# Patient Record
Sex: Male | Born: 1940 | Race: White | Hispanic: No | Marital: Married
Health system: Midwestern US, Academic
[De-identification: ages and names within clinical notes are randomized; demographics above are authoritative.]

---

## 2016-10-19 ENCOUNTER — Encounter: Admit: 2016-10-19 | Discharge: 2016-10-19 | Payer: MEDICARE

## 2016-10-19 DIAGNOSIS — I4891 Unspecified atrial fibrillation: Principal | ICD-10-CM

## 2016-10-22 ENCOUNTER — Encounter: Admit: 2016-10-22 | Discharge: 2016-10-22 | Payer: MEDICARE

## 2016-10-22 ENCOUNTER — Ambulatory Visit: Admit: 2016-10-22 | Discharge: 2016-10-23 | Payer: MEDICARE

## 2016-10-22 DIAGNOSIS — F17200 Nicotine dependence, unspecified, uncomplicated: ICD-10-CM

## 2016-10-22 DIAGNOSIS — I4891 Unspecified atrial fibrillation: ICD-10-CM

## 2016-10-22 DIAGNOSIS — R9439 Abnormal result of other cardiovascular function study: Principal | ICD-10-CM

## 2016-10-22 DIAGNOSIS — I10 Essential (primary) hypertension: ICD-10-CM

## 2016-10-22 DIAGNOSIS — I2510 Atherosclerotic heart disease of native coronary artery without angina pectoris: ICD-10-CM

## 2016-10-22 DIAGNOSIS — G4730 Sleep apnea, unspecified: ICD-10-CM

## 2016-10-22 DIAGNOSIS — I480 Paroxysmal atrial fibrillation: ICD-10-CM

## 2016-10-22 DIAGNOSIS — E785 Hyperlipidemia, unspecified: ICD-10-CM

## 2016-10-22 DIAGNOSIS — E669 Obesity, unspecified: ICD-10-CM

## 2016-10-22 DIAGNOSIS — E119 Type 2 diabetes mellitus without complications: ICD-10-CM

## 2016-10-22 DIAGNOSIS — E782 Mixed hyperlipidemia: ICD-10-CM

## 2016-10-22 MED ORDER — SIMVASTATIN 80 MG PO TAB
80 mg | ORAL_TABLET | Freq: Every evening | ORAL | 3 refills | Status: AC
Start: 2016-10-22 — End: 2018-03-25

## 2016-10-22 NOTE — Progress Notes
Date of Service: 10/22/2016    Douglas Fernandez is a 76 y.o. male.       HPI     I had the pleasure of seeing Douglas Fernandez today for follow-up of his coronary artery disease post history of PCI, paroxysmal atrial fibrillation currently controlled on dofetilide 250 mg twice daily and Coumadin anticoagulation, hypertension, hyperlipidemia, diabetes mellitus type 2, chronic lower leg edema with stasis changes, obstructive sleep apnea using CPAP, and morbid obesity.  He was seen in our office on August 20, 2016 by Dr. Avie Arenas.  At that time he was having no cardiovascular complaints and his EKG revealed a normal sinus rhythm.  His dofetilide dose had been decreased previously due to renal failure.    Douglas Fernandez presents to the office today telling me that very occasionally with change of position he will briefly feel dizzy.  He does have a cough in the morning that he blames on his CPAP causing his throat to be dry.  He does note chronic mild lower leg edema.  He has stasis changes on his lower legs and healing  water blisters .  He tells me he works very hard at a low salt diet.  He notes his blood sugars have been running very well controlled recently.  His main complaint today is he does have burning in his feet.    Preliminary EKG reveals a sinus rhythm at 65 bpm.  Multiple atrial premature complexes.  Borderline T abnormalities, anterior???lateral leads.  PR 184, QRS 90, QTc 485.    Assessment and plan    1.  Coronary artery disease post PCI of the RCA and LAD.  He denies any chest pain.  He continues on aspirin, metoprolol, and valsartan.    2.  Paroxysmal atrial fibrillation???stable.  His EKG reveals he is in sinus rhythm.  He is not aware of any palpitations although he tells me he has never been aware of palpitations.  He continues on dofetilide 250 mg daily and Coumadin anticoagulation.  His INRs are followed through our office and his last INR was at goal at 2.3 on 09/07/2016.    3.  Hypertension at goal. 4.  Hyperlipidemia.  His most recent cholesterol labs from August 20, 2016 revealed total cholesterol 156, triglycerides 196, HDL 41, LDL 92.  He tells me he is tolerating his simvastatin 20 mg daily without any problems.  He tells me he cannot remember ever being on a higher dose of simvastatin.  With his history of coronary artery disease his LDL goal is less than 70.  He has been asked to increase his simvastatin to 80 mg daily.  He has been given a lab order to check a fasting lipid profile AST and ALT in 3 months.    5.  Obstructive sleep apnea???wearing CPAP consistently at night.    6. Morbid obesity.  We discussed working harder at portion control to help with weight loss.    7.  Feet burning.  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  He has been asked to follow-up with his PCP to address this.    He will plan to follow-up in our office with Dr. Doretha Imus in 3 months.  We can readdress his lipid labs at that time.    Thank you for the opportunity to participate in the care of this patient.  Please feel free to call us if you have any questions or concerns.    Loraine Leriche,  APRN-C  DRB         Vitals:    10/22/16 1417 10/22/16 1426   BP: 112/68 118/68   Pulse: 65    Weight: 120.5 kg (265 lb 9.6 oz)    Height: 1.753 m (5' 9)      Body mass index is 39.22 kg/m???.     Past Medical History  Patient Active Problem List    Diagnosis Date Noted   ??? On dofetilide therapy 08/20/2016   ??? Warfarin anticoagulation 08/20/2016   ??? Bilateral edema of lower extremity 05/08/2014   ??? Sleep apnea 12/06/2012   ??? Renal failure 08/25/2012   ??? Atrial fibrillation (HCC) 06/30/2012     06/24/2012 - Symptoms of DOE, AF on EKG.  07/28/2012 - TEE and DCCV:  Successful DC cardioversion of Atrial fibrillation to sinus rhythm.  08/25/2012 - Pt in AF, Multaq stopped.  11/21/2012 - Hospital admission for Tikosyn initiation, DCCV to restore NSR.     ??? History of tobacco abuse 06/30/2012 ??? Dyspnea 12/19/2010   ??? Chest pain 10/15/2009   ??? Type II diabetes mellitus (HCC) 10/15/2009   ??? Angina pectoris (HCC) 11/25/2006   ??? CAD (coronary artery disease) 11/25/2006     11/2006:  DES to mid and distal LAD after abnormal stress testing, done for chest discomfort  10/09 :   DES to mid-LAD after presentation with ACS related to restenosis.  Noted to have 60% distal RCA stenosis at that time.  01/11/2011 - Stress Test:  This is a normal study.  There are no fixed or reversible perfusion abnormalities identified.  The LV function is preserved with an EF calculated at 67 percent.  The ECG portion of this study is unremarkable and not suggestive of ischemia.  No high risk prognostic indicators are associated with this study.       ??? Abnormal cardiovascular stress test    ??? Obesity    ??? Hypertension    ??? Hyperlipidemia          Review of Systems   Constitution: Positive for weight gain.   HENT: Positive for hearing loss.    Eyes: Negative.    Cardiovascular: Positive for claudication, dyspnea on exertion and leg swelling.   Respiratory: Positive for sleep disturbances due to breathing.    Endocrine: Negative.    Hematologic/Lymphatic: Negative.    Skin: Negative.    Musculoskeletal: Positive for arthritis, back pain, joint pain, joint swelling and muscle weakness.   Gastrointestinal: Positive for heartburn.   Genitourinary: Negative.    Neurological: Negative.    Psychiatric/Behavioral: Negative.    Allergic/Immunologic: Negative.    He denies having chest pain, palpitations, PND, orthopnea,  pre-syncope, syncope,  wheezing, shortness of air, dyspnea, sputum production, or hemoptysis. He uses no tobacco products.      Physical Exam   General Appearance: resting comfortably, no acute distress, obese  Skin: warm, moist, healing water blisters on lower legs bilaterally  Eyes: conjunctivae and lids normal, pupils are equal and round  Lips & Oral Mucosa: no pallor or cyanosis  Ear, Nose, Throat: No deformities Neck Veins: neck veins are flat, neck veins are not distended  Thyroid: no nodules, masses, tenderness or enlargement  Chest Inspection: chest is normal in appearance  Respiratory Effort: breathing is unlabored, no respiratory distress  Auscultation/Percussion: lungs clear to auscultation, no rales, rhonchi, or wheezing  PMI: PMI not enlarged or displaced  Cardiac Rhythm: regular rhythm and normal rate  Cardiac Auscultation: Normal S1 & S2,  no S3 or S4, no rub  Murmurs: no cardiac murmurs   Carotid Arteries: normal carotid upstroke bilaterally, no bruits  Pedal Pulses: normal symmetric pedal pulses  Lower Extremity Edema: 1+ bialteral lower extremity edema  Abdominal Exam: soft, non-tender, no masses, bowel sounds normal  Abdominal Aorta: nonpalpable abdominal aorta; no abdominal bruits  Liver & Spleen: no organomegaly  Gait & Station: normal balance and gait  Muscle Strength: normal strength and tone  Neurologic Exam: neurological assessment grossly intact  Orientation: oriented to time, place and person  Affect & Mood: appropriate and sustained affect  Other: moves all extremities            Problems Addressed Today  No diagnosis found.                  Current Medications (including today's revisions)  ??? albuterol (VENTOLIN HFA) 90 mcg/actuation inhaler Inhale 2 puffs by mouth into the lungs every 6 hours as needed for Wheezing or Shortness of Breath. Shake well before use.   ??? aspirin EC 81 mg tablet Take 81 mg by mouth at bedtime daily.   ??? budesonide/formoterol (SYMBICORT) 160/4.5 mcg HFAA inhalation Inhale 2 Puffs by mouth twice daily.     ??? dofetilide (TIKOSYN) 250 mcg capsule Take 1 capsule by mouth twice daily.   ??? fenofibrate micronized (LOFIBRA) 134 mg capsule TAKE 1 CAPSULE DAILY BEFORE BREAKFAST   ??? furosemide (LASIX) 40 mg tablet TAKE 2 TABLETS IN THE MORNING AND TAKE 1 TABLET IN THE EVENING   ??? insulin glargine (LANTUS SOLOSTAR, BASAGLAR) 100 unit/mL (3 mL) injection PEN Inject 60 Units under the skin at bedtime daily.   ??? magnesium oxide (MAG-OX) 400 mg tablet TAKE 1 TABLET BY MOUTH TWICE A DAY   ??? metFORMIN (GLUCOPHAGE) 1,000 mg tablet Take 1,000 mg by mouth twice daily with meals.   ??? metoprolol (LOPRESSOR) 50 mg tablet Take 1 Tab by mouth twice daily.   ??? nitroglycerin (NITROSTAT) 0.4 mg tablet Place 0.4 mg under tongue every 5 minutes as needed.   ??? omeprazole DR(+) (PRILOSEC) 20 mg PO capsule Take 20 mg by mouth at bedtime daily.   ??? potassium chloride SR (K-DUR) 20 mEq tablet Take 40 meq in the am alternating with 20 meq in the am   ??? simvastatin (ZOCOR) 20 mg tablet TAKE 1 TABLET DAILY AT BEDTIME   ??? SYNTHROID 75 mcg tablet Take 1 tablet by mouth daily.   ??? tiotropium (SPIRIVA WITH HANDIHALER) 18 mcg capsule for inhaler Place 18 mcg into inhaler and inhale into lungs as directed daily.   ??? valsartan (DIOVAN) 320 mg tablet TAKE 1 TABLET DAILY   ??? warfarin (COUMADIN) 4 mg tablet TAKE 1 TO 2 TABLETS DAILY AS DIRECTED BY [facility]

## 2016-10-26 ENCOUNTER — Encounter: Admit: 2016-10-26 | Discharge: 2016-10-26 | Payer: MEDICARE

## 2016-11-02 ENCOUNTER — Encounter: Admit: 2016-11-02 | Discharge: 2016-11-02 | Payer: MEDICARE

## 2016-11-06 ENCOUNTER — Encounter: Admit: 2016-11-06 | Discharge: 2016-11-06 | Payer: MEDICARE

## 2016-11-06 NOTE — Telephone Encounter
-----   Message from Nickolas MadridMarina Hannen, MD sent at 11/05/2016  5:17 PM CDT -----  Please call this patient when you have a chance and inform his echocardiogram is overall unremarkable. Thank you!    ----- Message -----  From: Laurence Alyosamond, Thomas L, MD  Sent: 10/27/2016   8:44 AM  To: Nickolas MadridMarina Hannen, MD

## 2016-11-06 NOTE — Telephone Encounter
Spoke with pt and he was given the information as below.

## 2016-11-09 ENCOUNTER — Encounter: Admit: 2016-11-09 | Discharge: 2016-11-09 | Payer: MEDICARE

## 2016-11-09 DIAGNOSIS — I480 Paroxysmal atrial fibrillation: Principal | ICD-10-CM

## 2016-11-16 ENCOUNTER — Encounter: Admit: 2016-11-16 | Discharge: 2016-11-16 | Payer: MEDICARE

## 2016-11-16 DIAGNOSIS — I480 Paroxysmal atrial fibrillation: Principal | ICD-10-CM

## 2016-11-23 ENCOUNTER — Encounter: Admit: 2016-11-23 | Discharge: 2016-11-23 | Payer: MEDICARE

## 2016-11-30 ENCOUNTER — Encounter: Admit: 2016-11-30 | Discharge: 2016-11-30 | Payer: MEDICARE

## 2016-11-30 DIAGNOSIS — I480 Paroxysmal atrial fibrillation: Principal | ICD-10-CM

## 2016-12-09 ENCOUNTER — Encounter: Admit: 2016-12-09 | Discharge: 2016-12-09 | Payer: MEDICARE

## 2016-12-09 MED ORDER — LOSARTAN 100 MG PO TAB
100 mg | ORAL_TABLET | Freq: Every day | ORAL | 3 refills | 30.00000 days | Status: AC
Start: 2016-12-09 — End: 2018-01-11

## 2016-12-09 NOTE — Telephone Encounter
Received request from pharmacy for valsartan recall.  Per Dr. Avie ArenasHannen, switch to Losartan 100mg .  New script sent to pharmacy for recall.

## 2016-12-10 ENCOUNTER — Encounter: Admit: 2016-12-10 | Discharge: 2016-12-10 | Payer: MEDICARE

## 2016-12-10 DIAGNOSIS — I480 Paroxysmal atrial fibrillation: Principal | ICD-10-CM

## 2016-12-14 ENCOUNTER — Encounter: Admit: 2016-12-14 | Discharge: 2016-12-14 | Payer: MEDICARE

## 2016-12-22 ENCOUNTER — Encounter: Admit: 2016-12-22 | Discharge: 2016-12-22 | Payer: MEDICARE

## 2016-12-22 NOTE — Progress Notes
.  Did not contact pt with therapeutic home INR result as per request in chart notes. Pt to continue current therapeutic dose and recheck again in ~ 1 week. No further needs identified at this time.

## 2016-12-24 ENCOUNTER — Encounter: Admit: 2016-12-24 | Discharge: 2016-12-24 | Payer: MEDICARE

## 2016-12-24 MED ORDER — DOFETILIDE 250 MCG PO CAP
250 ug | ORAL_CAPSULE | Freq: Two times a day (BID) | ORAL | 3 refills | Status: AC
Start: 2016-12-24 — End: 2018-11-15

## 2016-12-29 ENCOUNTER — Encounter: Admit: 2016-12-29 | Discharge: 2016-12-29 | Payer: MEDICARE

## 2016-12-29 DIAGNOSIS — I480 Paroxysmal atrial fibrillation: Principal | ICD-10-CM

## 2017-01-05 ENCOUNTER — Encounter: Admit: 2017-01-05 | Discharge: 2017-01-05 | Payer: MEDICARE

## 2017-01-05 DIAGNOSIS — I480 Paroxysmal atrial fibrillation: Principal | ICD-10-CM

## 2017-01-12 ENCOUNTER — Encounter: Admit: 2017-01-12 | Discharge: 2017-01-12 | Payer: MEDICARE

## 2017-01-12 DIAGNOSIS — I480 Paroxysmal atrial fibrillation: Principal | ICD-10-CM

## 2017-01-19 ENCOUNTER — Encounter: Admit: 2017-01-19 | Discharge: 2017-01-19 | Payer: MEDICARE

## 2017-01-19 DIAGNOSIS — I480 Paroxysmal atrial fibrillation: Principal | ICD-10-CM

## 2017-01-26 ENCOUNTER — Encounter: Admit: 2017-01-26 | Discharge: 2017-01-26 | Payer: MEDICARE

## 2017-01-26 DIAGNOSIS — I480 Paroxysmal atrial fibrillation: Principal | ICD-10-CM

## 2017-01-28 ENCOUNTER — Encounter: Admit: 2017-01-28 | Discharge: 2017-01-28 | Payer: MEDICARE

## 2017-01-28 DIAGNOSIS — I2510 Atherosclerotic heart disease of native coronary artery without angina pectoris: Principal | ICD-10-CM

## 2017-01-28 LAB — LIPID PROFILE
Lab: 135 10*3/uL — ABNORMAL LOW (ref 150–200)
Lab: 164
Lab: 3
Lab: 42
Lab: 63

## 2017-01-28 LAB — AST (SGOT): Lab: 14

## 2017-01-28 LAB — ALT (SGPT): Lab: 14

## 2017-02-01 ENCOUNTER — Encounter: Admit: 2017-02-01 | Discharge: 2017-02-01 | Payer: MEDICARE

## 2017-02-04 ENCOUNTER — Encounter: Admit: 2017-02-04 | Discharge: 2017-02-04 | Payer: MEDICARE

## 2017-02-04 ENCOUNTER — Ambulatory Visit: Admit: 2017-02-04 | Discharge: 2017-02-05 | Payer: MEDICARE

## 2017-02-04 DIAGNOSIS — N189 Chronic kidney disease, unspecified: ICD-10-CM

## 2017-02-04 DIAGNOSIS — I480 Paroxysmal atrial fibrillation: ICD-10-CM

## 2017-02-04 DIAGNOSIS — R9439 Abnormal result of other cardiovascular function study: Principal | ICD-10-CM

## 2017-02-04 DIAGNOSIS — Z7901 Long term (current) use of anticoagulants: Secondary | ICD-10-CM

## 2017-02-04 DIAGNOSIS — Z01810 Encounter for preprocedural cardiovascular examination: ICD-10-CM

## 2017-02-04 DIAGNOSIS — I4891 Unspecified atrial fibrillation: ICD-10-CM

## 2017-02-04 DIAGNOSIS — I10 Essential (primary) hypertension: ICD-10-CM

## 2017-02-04 DIAGNOSIS — E785 Hyperlipidemia, unspecified: ICD-10-CM

## 2017-02-04 DIAGNOSIS — R079 Chest pain, unspecified: ICD-10-CM

## 2017-02-04 DIAGNOSIS — E7800 Pure hypercholesterolemia, unspecified: ICD-10-CM

## 2017-02-04 DIAGNOSIS — R0602 Shortness of breath: ICD-10-CM

## 2017-02-04 DIAGNOSIS — E118 Type 2 diabetes mellitus with unspecified complications: ICD-10-CM

## 2017-02-04 DIAGNOSIS — G4733 Obstructive sleep apnea (adult) (pediatric): ICD-10-CM

## 2017-02-04 DIAGNOSIS — E119 Type 2 diabetes mellitus without complications: ICD-10-CM

## 2017-02-04 DIAGNOSIS — E669 Obesity, unspecified: ICD-10-CM

## 2017-02-04 DIAGNOSIS — Z79899 Other long term (current) drug therapy: ICD-10-CM

## 2017-02-04 DIAGNOSIS — G4730 Sleep apnea, unspecified: ICD-10-CM

## 2017-02-04 DIAGNOSIS — I2510 Atherosclerotic heart disease of native coronary artery without angina pectoris: ICD-10-CM

## 2017-02-04 DIAGNOSIS — F17200 Nicotine dependence, unspecified, uncomplicated: ICD-10-CM

## 2017-02-04 DIAGNOSIS — Z87891 Personal history of nicotine dependence: ICD-10-CM

## 2017-02-04 MED ORDER — METOPROLOL TARTRATE 50 MG PO TAB
50 mg | ORAL_TABLET | Freq: Two times a day (BID) | ORAL | 3 refills | 90.00000 days | Status: AC
Start: 2017-02-04 — End: 2018-03-15

## 2017-02-04 NOTE — Progress Notes
Date of Service: 02/04/2017    Douglas Fernandez is a 76 y.o. male.       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  This patient also has osteoarthritis and is scheduled to undergo lumbar injection in the near future.    He has been stable from a cardiac standpoint.  Patient has not experienced symptoms of chest pain or heart palpitations.  The heart rhythm continues to remain normal sinus with a normal QT and QTc.  The dose has been adjusted due to renal failure and creatinine clearance.         Vitals:    02/04/17 1351 02/04/17 1407   BP: 100/60 104/58   Pulse: 92    Weight: 120.4 kg (265 lb 6.4 oz)    Height: 1.753 m (5' 9)      Body mass index is 39.19 kg/m???.     Past Medical History  Patient Active Problem List    Diagnosis Date Noted   ??? On dofetilide therapy 08/20/2016   ??? Warfarin anticoagulation 08/20/2016   ??? Bilateral edema of lower extremity 05/08/2014   ??? Sleep apnea 12/06/2012   ??? Renal failure 08/25/2012   ??? Atrial fibrillation (HCC) 06/30/2012     06/24/2012 - Symptoms of DOE, AF on EKG.  07/28/2012 - TEE and DCCV:  Successful DC cardioversion of Atrial fibrillation to sinus rhythm.  08/25/2012 - Pt in AF, Multaq stopped.  11/21/2012 - Hospital admission for Tikosyn initiation, DCCV to restore NSR.     ??? History of tobacco abuse 06/30/2012   ??? Dyspnea 12/19/2010   ??? Chest pain 10/15/2009   ??? Type II diabetes mellitus (HCC) 10/15/2009   ??? Angina pectoris (HCC) 11/25/2006   ??? CAD (coronary artery disease) 11/25/2006     11/2006:  DES to mid and distal LAD after abnormal stress testing, done for chest discomfort  10/09 :   DES to mid-LAD after presentation with ACS related to restenosis.  Noted to have 60% distal RCA stenosis at that time. 01/11/2011 - Stress Test:  This is a normal study.  There are no fixed or reversible perfusion abnormalities identified.  The LV function is preserved with an EF calculated at 67 percent.  The ECG portion of this study is unremarkable and not suggestive of ischemia.  No high risk prognostic indicators are associated with this study.       ??? Abnormal cardiovascular stress test    ??? Obesity    ??? Hypertension    ??? Hyperlipidemia          Review of Systems   Constitution: Negative.   HENT: Positive for hearing loss.    Eyes: Negative.    Cardiovascular: Positive for claudication, dyspnea on exertion and leg swelling.   Respiratory: Positive for shortness of breath.    Endocrine: Positive for polyuria.   Hematologic/Lymphatic: Negative.    Skin: Negative.    Musculoskeletal: Positive for arthritis, back pain and stiffness.   Gastrointestinal: Positive for heartburn.   Genitourinary: Positive for decreased libido, dysuria, frequency and incomplete emptying.   Neurological: Negative.    Psychiatric/Behavioral: Negative.    Allergic/Immunologic: Negative.        Physical Exam  General Appearance: Obese  Skin: warm, moist, no ulcers or xanthomas  Eyes:  conjunctivae and lids normal, pupils are equal and round  Lips & Oral Mucosa: no pallor or cyanosis  Neck Veins: neck veins are flat, neck veins are not distended  Chest Inspection: chest is normal in appearance  Respiratory Effort: breathing comfortably, no respiratory distress  Auscultation/Percussion: lungs clear to auscultation, no rales or rhonchi, no wheezing  Cardiac Rhythm: regular rhythm and normal rate  Cardiac Auscultation: S1, S2 normal, no rub, no gallop  Murmurs: no murmur  Carotid Arteries: normal carotid upstroke bilaterally, no bruit  Lower Extremity Edema: no lower extremity edema  Abdominal Exam: soft, non-tender, no masses, bowel sounds normal, protuberant abdomen  Liver & Spleen: no organomegaly Language and Memory: patient responsive and seems to comprehend information  Neurologic Exam: neurological assessment grossly intact      Cardiovascular Studies  Twelve-lead EKG demonstrates normal sinus rhythm, no ST segment T-wave changes, QT segment 396 ms, QTC 490 ms.    Problems Addressed Today  Encounter Diagnoses   Name Primary?   ??? Warfarin anticoagulation Yes   ??? Hypomagnesemia    ??? Coronary artery disease involving native heart without angina pectoris, unspecified vessel or lesion type    ??? Paroxysmal atrial fibrillation (HCC)    ??? Chronic anticoagulation    ??? Abnormal cardiovascular stress test    ??? Essential hypertension    ??? Pure hypercholesterolemia    ??? Chest pain, unspecified type    ??? Type 2 diabetes mellitus with complication, without long-term current use of insulin (HCC)    ??? Shortness of breath    ??? History of tobacco abuse    ??? Chronic kidney disease, unspecified CKD stage    ??? Obstructive sleep apnea syndrome    ??? On dofetilide therapy    ??? Pre-operative cardiovascular examination        Assessment and Plan     Assessment:    1.  Preoperative evaluation preceding lumbar injection    2.  Stable ischemic heart disease  ??? Patient underwent PCI of the RCA on October 27, 2009  ??? Patient underwent previous PCI of the LAD  ??? Currently patient does not have any symptoms compatible with angina    3.  History of paroxysmal atrial fibrillation  ??? Patient has been able to maintain sinus mechanism on dofetilide  ??? Patient is anticoagulated with warfarin, goal INR is between 2 and 3    4.  Sleep apnea  ??? Patient does use a CPAP machine    Plan:    1.  From a cardiac standpoint this patient is low risk to undergo a lumbar injection.  Next  2.  Discontinue warfarin 72 hours prior to the procedure, please resume it only once safe from a neurological standpoint.  3.  Follow-up office visit in 6 months         Current Medications (including today's revisions) ??? albuterol (VENTOLIN HFA) 90 mcg/actuation inhaler Inhale 2 puffs by mouth into the lungs every 6 hours as needed for Wheezing or Shortness of Breath. Shake well before use.   ??? budesonide/formoterol (SYMBICORT) 160/4.5 mcg HFAA inhalation Inhale 2 Puffs by mouth twice daily.     ??? dofetilide (TIKOSYN) 250 mcg capsule Take 1 capsule by mouth twice daily.   ??? fenofibrate micronized (LOFIBRA) 134 mg capsule TAKE 1 CAPSULE DAILY BEFORE BREAKFAST   ??? furosemide (LASIX) 40 mg tablet TAKE 2 TABLETS IN THE MORNING AND TAKE 1 TABLET IN THE EVENING   ??? insulin glargine (LANTUS) 100 unit/mL injection Inject  60 Units under the skin at bedtime daily. 1 vial contains 10mL   ??? losartan(+) (COZAAR) 100 mg tablet Take 1 tablet by mouth daily.   ??? magnesium oxide (MAG-OX) 400 mg tablet TAKE 1 TABLET BY MOUTH TWICE A DAY   ??? metFORMIN (GLUCOPHAGE) 1,000 mg tablet Take 1,000 mg by mouth twice daily with meals.   ??? metoprolol (LOPRESSOR) 50 mg tablet Take 1 Tab by mouth twice daily.   ??? nitroglycerin (NITROSTAT) 0.4 mg tablet Place 0.4 mg under tongue every 5 minutes as needed.   ??? omeprazole DR(+) (PRILOSEC) 20 mg PO capsule Take 20 mg by mouth at bedtime daily.   ??? potassium chloride SR (K-DUR) 10 mEq tablet Take 10 mEq by mouth twice daily. Take with a meal and a full glass of water.   ??? simvastatin (ZOCOR) 80 mg tablet Take 1 tablet by mouth at bedtime daily.   ??? SYNTHROID 75 mcg tablet Take 1 tablet by mouth daily.   ??? tiotropium (SPIRIVA WITH HANDIHALER) 18 mcg capsule for inhaler Place 18 mcg into inhaler and inhale into lungs as directed daily.   ??? valsartan (DIOVAN) 320 mg tablet TAKE 1 TABLET DAILY   ??? warfarin (COUMADIN) 4 mg tablet TAKE 1 TO 2 TABLETS DAILY AS DIRECTED BY [facility]

## 2017-02-08 ENCOUNTER — Encounter: Admit: 2017-02-08 | Discharge: 2017-02-08 | Payer: MEDICARE

## 2017-02-11 ENCOUNTER — Encounter: Admit: 2017-02-11 | Discharge: 2017-02-11 | Payer: MEDICARE

## 2017-02-15 ENCOUNTER — Encounter: Admit: 2017-02-15 | Discharge: 2017-02-15 | Payer: MEDICARE

## 2017-02-15 DIAGNOSIS — I480 Paroxysmal atrial fibrillation: Principal | ICD-10-CM

## 2017-02-22 ENCOUNTER — Encounter: Admit: 2017-02-22 | Discharge: 2017-02-22 | Payer: MEDICARE

## 2017-02-22 DIAGNOSIS — I480 Paroxysmal atrial fibrillation: Principal | ICD-10-CM

## 2017-03-08 ENCOUNTER — Encounter: Admit: 2017-03-08 | Discharge: 2017-03-08 | Payer: MEDICARE

## 2017-03-08 DIAGNOSIS — I480 Paroxysmal atrial fibrillation: Principal | ICD-10-CM

## 2017-03-12 ENCOUNTER — Encounter: Admit: 2017-03-12 | Discharge: 2017-03-12 | Payer: MEDICARE

## 2017-03-12 DIAGNOSIS — I480 Paroxysmal atrial fibrillation: Principal | ICD-10-CM

## 2017-03-15 ENCOUNTER — Encounter: Admit: 2017-03-15 | Discharge: 2017-03-15 | Payer: MEDICARE

## 2017-03-22 ENCOUNTER — Encounter: Admit: 2017-03-22 | Discharge: 2017-03-22 | Payer: MEDICARE

## 2017-03-22 DIAGNOSIS — I480 Paroxysmal atrial fibrillation: Principal | ICD-10-CM

## 2017-03-29 ENCOUNTER — Encounter: Admit: 2017-03-29 | Discharge: 2017-03-29 | Payer: MEDICARE

## 2017-04-05 ENCOUNTER — Encounter: Admit: 2017-04-05 | Discharge: 2017-04-05 | Payer: MEDICARE

## 2017-04-12 ENCOUNTER — Encounter: Admit: 2017-04-12 | Discharge: 2017-04-12 | Payer: MEDICARE

## 2017-04-12 DIAGNOSIS — I480 Paroxysmal atrial fibrillation: Principal | ICD-10-CM

## 2017-04-20 ENCOUNTER — Encounter: Admit: 2017-04-20 | Discharge: 2017-04-20 | Payer: MEDICARE

## 2017-04-20 DIAGNOSIS — I480 Paroxysmal atrial fibrillation: Principal | ICD-10-CM

## 2017-04-27 ENCOUNTER — Encounter: Admit: 2017-04-27 | Discharge: 2017-04-27 | Payer: MEDICARE

## 2017-04-27 DIAGNOSIS — I480 Paroxysmal atrial fibrillation: Principal | ICD-10-CM

## 2017-05-04 ENCOUNTER — Encounter: Admit: 2017-05-04 | Discharge: 2017-05-04 | Payer: MEDICARE

## 2017-05-04 DIAGNOSIS — I480 Paroxysmal atrial fibrillation: Principal | ICD-10-CM

## 2017-05-12 ENCOUNTER — Encounter: Admit: 2017-05-12 | Discharge: 2017-05-12 | Payer: MEDICARE

## 2017-05-12 DIAGNOSIS — I480 Paroxysmal atrial fibrillation: Principal | ICD-10-CM

## 2017-05-17 ENCOUNTER — Encounter: Admit: 2017-05-17 | Discharge: 2017-05-17 | Payer: MEDICARE

## 2017-05-24 ENCOUNTER — Encounter: Admit: 2017-05-24 | Discharge: 2017-05-24 | Payer: MEDICARE

## 2017-05-31 ENCOUNTER — Encounter: Admit: 2017-05-31 | Discharge: 2017-05-31 | Payer: MEDICARE

## 2017-05-31 DIAGNOSIS — I480 Paroxysmal atrial fibrillation: Principal | ICD-10-CM

## 2017-06-08 ENCOUNTER — Encounter: Admit: 2017-06-08 | Discharge: 2017-06-08 | Payer: MEDICARE

## 2017-06-08 DIAGNOSIS — I480 Paroxysmal atrial fibrillation: Principal | ICD-10-CM

## 2017-06-14 ENCOUNTER — Encounter: Admit: 2017-06-14 | Discharge: 2017-06-14 | Payer: MEDICARE

## 2017-06-14 DIAGNOSIS — Z7901 Long term (current) use of anticoagulants: ICD-10-CM

## 2017-06-14 DIAGNOSIS — I480 Paroxysmal atrial fibrillation: Principal | ICD-10-CM

## 2017-06-14 LAB — PROTIME INR (PT): Lab: 2.3 % (ref 41–77)

## 2017-06-22 ENCOUNTER — Encounter: Admit: 2017-06-22 | Discharge: 2017-06-22 | Payer: MEDICARE

## 2017-06-22 DIAGNOSIS — I4891 Unspecified atrial fibrillation: Principal | ICD-10-CM

## 2017-06-24 LAB — COMPREHENSIVE METABOLIC PANEL
Lab: 1.8 — ABNORMAL HIGH (ref 0.72–1.25)
Lab: 101
Lab: 137 — ABNORMAL HIGH (ref 83–110)
Lab: 25
Lab: 51 — ABNORMAL HIGH (ref 8.4–25.7)
Lab: 7.6 — ABNORMAL HIGH (ref 11.5–14.5)

## 2017-06-28 ENCOUNTER — Encounter: Admit: 2017-06-28 | Discharge: 2017-06-28 | Payer: MEDICARE

## 2017-06-28 DIAGNOSIS — I480 Paroxysmal atrial fibrillation: Principal | ICD-10-CM

## 2017-06-28 DIAGNOSIS — Z7901 Long term (current) use of anticoagulants: ICD-10-CM

## 2017-06-28 LAB — PROTIME INR (PT): Lab: 2.6

## 2017-07-01 ENCOUNTER — Encounter: Admit: 2017-07-01 | Discharge: 2017-07-01 | Payer: MEDICARE

## 2017-07-01 ENCOUNTER — Ambulatory Visit: Admit: 2017-07-01 | Discharge: 2017-07-02 | Payer: MEDICARE

## 2017-07-01 DIAGNOSIS — R0602 Shortness of breath: Principal | ICD-10-CM

## 2017-07-06 ENCOUNTER — Encounter: Admit: 2017-07-06 | Discharge: 2017-07-06 | Payer: MEDICARE

## 2017-07-09 ENCOUNTER — Encounter: Admit: 2017-07-09 | Discharge: 2017-07-09 | Payer: MEDICARE

## 2017-07-12 ENCOUNTER — Encounter: Admit: 2017-07-12 | Discharge: 2017-07-12 | Payer: MEDICARE

## 2017-07-12 DIAGNOSIS — Z7901 Long term (current) use of anticoagulants: ICD-10-CM

## 2017-07-12 DIAGNOSIS — I480 Paroxysmal atrial fibrillation: Principal | ICD-10-CM

## 2017-07-12 LAB — PROTIME INR (PT): Lab: 2.4

## 2017-07-20 ENCOUNTER — Encounter: Admit: 2017-07-20 | Discharge: 2017-07-20 | Payer: MEDICARE

## 2017-07-27 ENCOUNTER — Encounter: Admit: 2017-07-27 | Discharge: 2017-07-27 | Payer: MEDICARE

## 2017-08-04 ENCOUNTER — Encounter: Admit: 2017-08-04 | Discharge: 2017-08-04 | Payer: MEDICARE

## 2017-08-04 DIAGNOSIS — I480 Paroxysmal atrial fibrillation: Principal | ICD-10-CM

## 2017-08-04 DIAGNOSIS — Z7901 Long term (current) use of anticoagulants: ICD-10-CM

## 2017-08-04 LAB — PROTIME INR (PT): Lab: 2.7 g/dL (ref 12.0–15.0)

## 2017-08-09 ENCOUNTER — Encounter: Admit: 2017-08-09 | Discharge: 2017-08-09 | Payer: MEDICARE

## 2017-08-09 DIAGNOSIS — I480 Paroxysmal atrial fibrillation: Principal | ICD-10-CM

## 2017-08-09 DIAGNOSIS — Z7901 Long term (current) use of anticoagulants: ICD-10-CM

## 2017-08-09 LAB — PROTIME INR (PT)
Lab: 2.7
Lab: 2.7

## 2017-08-10 ENCOUNTER — Ambulatory Visit: Admit: 2017-08-10 | Discharge: 2017-08-11 | Payer: MEDICARE

## 2017-08-10 ENCOUNTER — Encounter: Admit: 2017-08-10 | Discharge: 2017-08-10 | Payer: MEDICARE

## 2017-08-10 DIAGNOSIS — E669 Obesity, unspecified: ICD-10-CM

## 2017-08-10 DIAGNOSIS — E785 Hyperlipidemia, unspecified: ICD-10-CM

## 2017-08-10 DIAGNOSIS — E1169 Type 2 diabetes mellitus with other specified complication: ICD-10-CM

## 2017-08-10 DIAGNOSIS — I10 Essential (primary) hypertension: ICD-10-CM

## 2017-08-10 DIAGNOSIS — N189 Chronic kidney disease, unspecified: ICD-10-CM

## 2017-08-10 DIAGNOSIS — E119 Type 2 diabetes mellitus without complications: ICD-10-CM

## 2017-08-10 DIAGNOSIS — Z79899 Other long term (current) drug therapy: ICD-10-CM

## 2017-08-10 DIAGNOSIS — I4891 Unspecified atrial fibrillation: ICD-10-CM

## 2017-08-10 DIAGNOSIS — G4733 Obstructive sleep apnea (adult) (pediatric): ICD-10-CM

## 2017-08-10 DIAGNOSIS — R9439 Abnormal result of other cardiovascular function study: Principal | ICD-10-CM

## 2017-08-10 DIAGNOSIS — I480 Paroxysmal atrial fibrillation: ICD-10-CM

## 2017-08-10 DIAGNOSIS — R0602 Shortness of breath: ICD-10-CM

## 2017-08-10 DIAGNOSIS — I2510 Atherosclerotic heart disease of native coronary artery without angina pectoris: ICD-10-CM

## 2017-08-10 DIAGNOSIS — E7800 Pure hypercholesterolemia, unspecified: Principal | ICD-10-CM

## 2017-08-10 DIAGNOSIS — G4730 Sleep apnea, unspecified: ICD-10-CM

## 2017-08-10 DIAGNOSIS — F17200 Nicotine dependence, unspecified, uncomplicated: ICD-10-CM

## 2017-08-12 ENCOUNTER — Encounter: Admit: 2017-08-12 | Discharge: 2017-08-12 | Payer: MEDICARE

## 2017-08-17 ENCOUNTER — Encounter: Admit: 2017-08-17 | Discharge: 2017-08-17 | Payer: MEDICARE

## 2017-08-23 ENCOUNTER — Encounter: Admit: 2017-08-23 | Discharge: 2017-08-23 | Payer: MEDICARE

## 2017-08-23 DIAGNOSIS — I480 Paroxysmal atrial fibrillation: Principal | ICD-10-CM

## 2017-08-23 DIAGNOSIS — Z7901 Long term (current) use of anticoagulants: ICD-10-CM

## 2017-08-23 LAB — PROTIME INR (PT): Lab: 2.8

## 2017-08-30 ENCOUNTER — Encounter: Admit: 2017-08-30 | Discharge: 2017-08-30 | Payer: MEDICARE

## 2017-09-06 ENCOUNTER — Encounter: Admit: 2017-09-06 | Discharge: 2017-09-06 | Payer: MEDICARE

## 2017-09-06 DIAGNOSIS — Z7901 Long term (current) use of anticoagulants: ICD-10-CM

## 2017-09-06 DIAGNOSIS — I480 Paroxysmal atrial fibrillation: ICD-10-CM

## 2017-09-06 DIAGNOSIS — I4891 Unspecified atrial fibrillation: Principal | ICD-10-CM

## 2017-09-06 LAB — PROTIME INR (PT): Lab: 3

## 2017-09-15 ENCOUNTER — Encounter: Admit: 2017-09-15 | Discharge: 2017-09-15 | Payer: MEDICARE

## 2017-09-15 DIAGNOSIS — Z7901 Long term (current) use of anticoagulants: ICD-10-CM

## 2017-09-15 DIAGNOSIS — I480 Paroxysmal atrial fibrillation: ICD-10-CM

## 2017-09-15 DIAGNOSIS — I4891 Unspecified atrial fibrillation: Principal | ICD-10-CM

## 2017-09-15 LAB — PROTIME INR (PT): Lab: 2.8 FL (ref 80–100)

## 2017-09-21 ENCOUNTER — Encounter: Admit: 2017-09-21 | Discharge: 2017-09-21 | Payer: MEDICARE

## 2017-09-21 DIAGNOSIS — I4891 Unspecified atrial fibrillation: Principal | ICD-10-CM

## 2017-09-21 DIAGNOSIS — I480 Paroxysmal atrial fibrillation: ICD-10-CM

## 2017-09-21 DIAGNOSIS — Z7901 Long term (current) use of anticoagulants: ICD-10-CM

## 2017-09-21 LAB — PROTIME INR (PT): Lab: 2.8 mg/dL — ABNORMAL HIGH (ref 8.5–10.6)

## 2017-09-28 ENCOUNTER — Encounter: Admit: 2017-09-28 | Discharge: 2017-09-28 | Payer: MEDICARE

## 2017-09-28 DIAGNOSIS — Z7901 Long term (current) use of anticoagulants: ICD-10-CM

## 2017-09-28 DIAGNOSIS — I480 Paroxysmal atrial fibrillation: ICD-10-CM

## 2017-09-28 DIAGNOSIS — I4891 Unspecified atrial fibrillation: Principal | ICD-10-CM

## 2017-09-28 LAB — PROTIME INR (PT): Lab: 2 mg/dL (ref 8.5–10.6)

## 2017-10-04 ENCOUNTER — Encounter: Admit: 2017-10-04 | Discharge: 2017-10-04 | Payer: MEDICARE

## 2017-10-04 DIAGNOSIS — I480 Paroxysmal atrial fibrillation: ICD-10-CM

## 2017-10-04 DIAGNOSIS — I4891 Unspecified atrial fibrillation: Principal | ICD-10-CM

## 2017-10-04 DIAGNOSIS — Z7901 Long term (current) use of anticoagulants: ICD-10-CM

## 2017-10-04 LAB — PROTIME INR (PT): Lab: 2.7

## 2017-10-12 LAB — PROTIME INR (PT): Lab: 2.6 pg (ref 26–34)

## 2017-10-13 ENCOUNTER — Encounter: Admit: 2017-10-13 | Discharge: 2017-10-13 | Payer: MEDICARE

## 2017-10-13 DIAGNOSIS — Z7901 Long term (current) use of anticoagulants: ICD-10-CM

## 2017-10-13 DIAGNOSIS — I4891 Unspecified atrial fibrillation: Principal | ICD-10-CM

## 2017-10-13 DIAGNOSIS — I480 Paroxysmal atrial fibrillation: ICD-10-CM

## 2017-10-19 ENCOUNTER — Encounter: Admit: 2017-10-19 | Discharge: 2017-10-19 | Payer: MEDICARE

## 2017-10-19 DIAGNOSIS — I4891 Unspecified atrial fibrillation: Principal | ICD-10-CM

## 2017-10-19 DIAGNOSIS — Z7901 Long term (current) use of anticoagulants: ICD-10-CM

## 2017-10-19 DIAGNOSIS — I480 Paroxysmal atrial fibrillation: ICD-10-CM

## 2017-10-19 LAB — PROTIME INR (PT): Lab: 2.7 K/UL (ref 0–0.20)

## 2017-10-26 ENCOUNTER — Encounter: Admit: 2017-10-26 | Discharge: 2017-10-26 | Payer: MEDICARE

## 2017-10-26 DIAGNOSIS — I4891 Unspecified atrial fibrillation: Principal | ICD-10-CM

## 2017-10-26 DIAGNOSIS — I480 Paroxysmal atrial fibrillation: ICD-10-CM

## 2017-10-26 DIAGNOSIS — Z7901 Long term (current) use of anticoagulants: ICD-10-CM

## 2017-10-26 LAB — PROTIME INR (PT): Lab: 2.1

## 2017-11-01 ENCOUNTER — Encounter: Admit: 2017-11-01 | Discharge: 2017-11-01 | Payer: MEDICARE

## 2017-11-01 DIAGNOSIS — I480 Paroxysmal atrial fibrillation: Principal | ICD-10-CM

## 2017-11-01 DIAGNOSIS — Z7901 Long term (current) use of anticoagulants: ICD-10-CM

## 2017-11-01 LAB — PROTIME INR (PT): Lab: 2.3

## 2017-11-09 ENCOUNTER — Encounter: Admit: 2017-11-09 | Discharge: 2017-11-09 | Payer: MEDICARE

## 2017-11-09 DIAGNOSIS — Z7901 Long term (current) use of anticoagulants: ICD-10-CM

## 2017-11-09 DIAGNOSIS — I4891 Unspecified atrial fibrillation: Principal | ICD-10-CM

## 2017-11-09 DIAGNOSIS — I480 Paroxysmal atrial fibrillation: Principal | ICD-10-CM

## 2017-11-09 LAB — PROTIME INR (PT): Lab: 2.2

## 2017-11-16 ENCOUNTER — Encounter: Admit: 2017-11-16 | Discharge: 2017-11-16 | Payer: MEDICARE

## 2017-11-16 DIAGNOSIS — I480 Paroxysmal atrial fibrillation: Principal | ICD-10-CM

## 2017-11-16 DIAGNOSIS — I4891 Unspecified atrial fibrillation: Principal | ICD-10-CM

## 2017-11-16 DIAGNOSIS — Z7901 Long term (current) use of anticoagulants: ICD-10-CM

## 2017-11-16 LAB — PROTIME INR (PT): Lab: 2.4

## 2017-11-23 LAB — PROTIME INR (PT): Lab: 2.6

## 2017-11-24 ENCOUNTER — Encounter: Admit: 2017-11-24 | Discharge: 2017-11-24 | Payer: MEDICARE

## 2017-11-24 DIAGNOSIS — Z7901 Long term (current) use of anticoagulants: ICD-10-CM

## 2017-11-24 DIAGNOSIS — I480 Paroxysmal atrial fibrillation: Principal | ICD-10-CM

## 2017-11-29 ENCOUNTER — Encounter: Admit: 2017-11-29 | Discharge: 2017-11-29 | Payer: MEDICARE

## 2017-11-29 DIAGNOSIS — Z7901 Long term (current) use of anticoagulants: ICD-10-CM

## 2017-11-29 DIAGNOSIS — I480 Paroxysmal atrial fibrillation: Principal | ICD-10-CM

## 2017-11-29 LAB — PROTIME INR (PT): Lab: 2

## 2017-12-14 ENCOUNTER — Encounter: Admit: 2017-12-14 | Discharge: 2017-12-14 | Payer: MEDICARE

## 2017-12-14 DIAGNOSIS — I480 Paroxysmal atrial fibrillation: Principal | ICD-10-CM

## 2017-12-14 DIAGNOSIS — I4891 Unspecified atrial fibrillation: Principal | ICD-10-CM

## 2017-12-14 DIAGNOSIS — Z7901 Long term (current) use of anticoagulants: ICD-10-CM

## 2017-12-14 LAB — PROTIME INR (PT): Lab: 2.2

## 2017-12-20 ENCOUNTER — Encounter: Admit: 2017-12-20 | Discharge: 2017-12-20 | Payer: MEDICARE

## 2017-12-28 ENCOUNTER — Encounter: Admit: 2017-12-28 | Discharge: 2017-12-28 | Payer: MEDICARE

## 2017-12-28 DIAGNOSIS — I4891 Unspecified atrial fibrillation: Principal | ICD-10-CM

## 2017-12-28 DIAGNOSIS — I480 Paroxysmal atrial fibrillation: Principal | ICD-10-CM

## 2017-12-28 DIAGNOSIS — Z7901 Long term (current) use of anticoagulants: ICD-10-CM

## 2017-12-28 LAB — PROTIME INR (PT): Lab: 2.3

## 2018-01-04 ENCOUNTER — Encounter: Admit: 2018-01-04 | Discharge: 2018-01-04 | Payer: MEDICARE

## 2018-01-04 DIAGNOSIS — I480 Paroxysmal atrial fibrillation: Principal | ICD-10-CM

## 2018-01-04 DIAGNOSIS — Z7901 Long term (current) use of anticoagulants: ICD-10-CM

## 2018-01-04 LAB — PROTIME INR (PT): Lab: 2.8

## 2018-01-11 ENCOUNTER — Encounter: Admit: 2018-01-11 | Discharge: 2018-01-11 | Payer: MEDICARE

## 2018-01-11 MED ORDER — LOSARTAN 100 MG PO TAB
ORAL_TABLET | Freq: Every day | ORAL | 1 refills | 90.00000 days | Status: AC
Start: 2018-01-11 — End: 2018-06-17

## 2018-01-12 LAB — PROTIME INR (PT): Lab: 2.4

## 2018-01-13 ENCOUNTER — Encounter: Admit: 2018-01-13 | Discharge: 2018-01-13 | Payer: MEDICARE

## 2018-01-13 DIAGNOSIS — I4891 Unspecified atrial fibrillation: Principal | ICD-10-CM

## 2018-01-13 DIAGNOSIS — Z7901 Long term (current) use of anticoagulants: ICD-10-CM

## 2018-01-13 DIAGNOSIS — I480 Paroxysmal atrial fibrillation: Principal | ICD-10-CM

## 2018-01-25 ENCOUNTER — Encounter: Admit: 2018-01-25 | Discharge: 2018-01-25 | Payer: MEDICARE

## 2018-01-25 DIAGNOSIS — I480 Paroxysmal atrial fibrillation: Principal | ICD-10-CM

## 2018-01-25 DIAGNOSIS — Z7901 Long term (current) use of anticoagulants: ICD-10-CM

## 2018-01-25 LAB — PROTIME INR (PT): Lab: 3

## 2018-01-31 ENCOUNTER — Encounter: Admit: 2018-01-31 | Discharge: 2018-01-31 | Payer: MEDICARE

## 2018-01-31 DIAGNOSIS — Z7901 Long term (current) use of anticoagulants: ICD-10-CM

## 2018-01-31 DIAGNOSIS — I4891 Unspecified atrial fibrillation: Principal | ICD-10-CM

## 2018-01-31 DIAGNOSIS — I480 Paroxysmal atrial fibrillation: Principal | ICD-10-CM

## 2018-01-31 LAB — PROTIME INR (PT): Lab: 2.7

## 2018-02-07 LAB — PROTIME INR (PT): Lab: 4.2

## 2018-02-08 ENCOUNTER — Encounter: Admit: 2018-02-08 | Discharge: 2018-02-08 | Payer: MEDICARE

## 2018-02-08 DIAGNOSIS — I4891 Unspecified atrial fibrillation: Principal | ICD-10-CM

## 2018-02-08 DIAGNOSIS — I480 Paroxysmal atrial fibrillation: Principal | ICD-10-CM

## 2018-02-08 DIAGNOSIS — Z7901 Long term (current) use of anticoagulants: ICD-10-CM

## 2018-02-14 ENCOUNTER — Encounter: Admit: 2018-02-14 | Discharge: 2018-02-14 | Payer: MEDICARE

## 2018-02-14 LAB — PROTIME INR (PT): Lab: 2.6 MMOL/L (ref 21–30)

## 2018-02-21 LAB — PROTIME INR (PT): Lab: 2.4 g/dL — ABNORMAL LOW (ref 13.5–16.5)

## 2018-02-22 ENCOUNTER — Encounter: Admit: 2018-02-22 | Discharge: 2018-02-22 | Payer: MEDICARE

## 2018-02-22 DIAGNOSIS — I480 Paroxysmal atrial fibrillation: Principal | ICD-10-CM

## 2018-02-22 DIAGNOSIS — I4891 Unspecified atrial fibrillation: Principal | ICD-10-CM

## 2018-02-22 DIAGNOSIS — Z7901 Long term (current) use of anticoagulants: ICD-10-CM

## 2018-02-24 ENCOUNTER — Encounter: Admit: 2018-02-24 | Discharge: 2018-02-24 | Payer: MEDICARE

## 2018-02-24 ENCOUNTER — Ambulatory Visit: Admit: 2018-02-24 | Discharge: 2018-02-25 | Payer: MEDICARE

## 2018-02-24 DIAGNOSIS — I4891 Unspecified atrial fibrillation: ICD-10-CM

## 2018-02-24 DIAGNOSIS — R600 Localized edema: ICD-10-CM

## 2018-02-24 DIAGNOSIS — E785 Hyperlipidemia, unspecified: ICD-10-CM

## 2018-02-24 DIAGNOSIS — I10 Essential (primary) hypertension: Principal | ICD-10-CM

## 2018-02-24 DIAGNOSIS — I2510 Atherosclerotic heart disease of native coronary artery without angina pectoris: ICD-10-CM

## 2018-02-24 DIAGNOSIS — E782 Mixed hyperlipidemia: ICD-10-CM

## 2018-02-24 DIAGNOSIS — Z79899 Other long term (current) drug therapy: ICD-10-CM

## 2018-02-24 DIAGNOSIS — Z87891 Personal history of nicotine dependence: ICD-10-CM

## 2018-02-24 DIAGNOSIS — G4730 Sleep apnea, unspecified: ICD-10-CM

## 2018-02-24 DIAGNOSIS — G4733 Obstructive sleep apnea (adult) (pediatric): ICD-10-CM

## 2018-02-24 DIAGNOSIS — E119 Type 2 diabetes mellitus without complications: ICD-10-CM

## 2018-02-24 DIAGNOSIS — F17200 Nicotine dependence, unspecified, uncomplicated: ICD-10-CM

## 2018-02-24 DIAGNOSIS — E669 Obesity, unspecified: ICD-10-CM

## 2018-02-24 DIAGNOSIS — Z7901 Long term (current) use of anticoagulants: ICD-10-CM

## 2018-02-24 DIAGNOSIS — R9439 Abnormal result of other cardiovascular function study: Principal | ICD-10-CM

## 2018-02-28 ENCOUNTER — Encounter: Admit: 2018-02-28 | Discharge: 2018-02-28 | Payer: MEDICARE

## 2018-02-28 DIAGNOSIS — I4891 Unspecified atrial fibrillation: Principal | ICD-10-CM

## 2018-02-28 DIAGNOSIS — Z7901 Long term (current) use of anticoagulants: ICD-10-CM

## 2018-02-28 DIAGNOSIS — I480 Paroxysmal atrial fibrillation: Principal | ICD-10-CM

## 2018-02-28 LAB — PROTIME INR (PT): Lab: 2.5

## 2018-03-07 ENCOUNTER — Encounter: Admit: 2018-03-07 | Discharge: 2018-03-07 | Payer: MEDICARE

## 2018-03-07 DIAGNOSIS — I4891 Unspecified atrial fibrillation: Principal | ICD-10-CM

## 2018-03-07 DIAGNOSIS — Z7901 Long term (current) use of anticoagulants: ICD-10-CM

## 2018-03-07 DIAGNOSIS — I480 Paroxysmal atrial fibrillation: ICD-10-CM

## 2018-03-07 LAB — PROTIME INR (PT): Lab: 2

## 2018-03-14 ENCOUNTER — Encounter: Admit: 2018-03-14 | Discharge: 2018-03-14 | Payer: MEDICARE

## 2018-03-14 DIAGNOSIS — E118 Type 2 diabetes mellitus with unspecified complications: ICD-10-CM

## 2018-03-14 DIAGNOSIS — R079 Chest pain, unspecified: ICD-10-CM

## 2018-03-14 DIAGNOSIS — Z87891 Personal history of nicotine dependence: ICD-10-CM

## 2018-03-14 DIAGNOSIS — I10 Essential (primary) hypertension: ICD-10-CM

## 2018-03-14 DIAGNOSIS — I2510 Atherosclerotic heart disease of native coronary artery without angina pectoris: ICD-10-CM

## 2018-03-14 DIAGNOSIS — Z7901 Long term (current) use of anticoagulants: ICD-10-CM

## 2018-03-14 DIAGNOSIS — I480 Paroxysmal atrial fibrillation: ICD-10-CM

## 2018-03-14 DIAGNOSIS — R9439 Abnormal result of other cardiovascular function study: ICD-10-CM

## 2018-03-14 DIAGNOSIS — R0602 Shortness of breath: ICD-10-CM

## 2018-03-14 DIAGNOSIS — E7800 Pure hypercholesterolemia, unspecified: ICD-10-CM

## 2018-03-14 DIAGNOSIS — N189 Chronic kidney disease, unspecified: ICD-10-CM

## 2018-03-15 ENCOUNTER — Encounter: Admit: 2018-03-15 | Discharge: 2018-03-15 | Payer: MEDICARE

## 2018-03-15 DIAGNOSIS — Z7901 Long term (current) use of anticoagulants: ICD-10-CM

## 2018-03-15 DIAGNOSIS — I480 Paroxysmal atrial fibrillation: Principal | ICD-10-CM

## 2018-03-15 DIAGNOSIS — I4891 Unspecified atrial fibrillation: Principal | ICD-10-CM

## 2018-03-15 LAB — PROTIME INR (PT): Lab: 2

## 2018-03-15 MED ORDER — METOPROLOL TARTRATE 50 MG PO TAB
ORAL_TABLET | Freq: Two times a day (BID) | ORAL | 4 refills | 90.00000 days | Status: AC
Start: 2018-03-15 — End: 2018-11-15

## 2018-03-24 LAB — PROTIME INR (PT): Lab: 2.7 K/UL (ref 1.8–7.0)

## 2018-03-25 ENCOUNTER — Encounter: Admit: 2018-03-25 | Discharge: 2018-03-25 | Payer: MEDICARE

## 2018-03-25 DIAGNOSIS — Z7901 Long term (current) use of anticoagulants: ICD-10-CM

## 2018-03-25 DIAGNOSIS — I2510 Atherosclerotic heart disease of native coronary artery without angina pectoris: Principal | ICD-10-CM

## 2018-03-25 DIAGNOSIS — I480 Paroxysmal atrial fibrillation: Principal | ICD-10-CM

## 2018-03-25 DIAGNOSIS — I4891 Unspecified atrial fibrillation: Principal | ICD-10-CM

## 2018-03-25 MED ORDER — SIMVASTATIN 80 MG PO TAB
80 mg | ORAL_TABLET | Freq: Every evening | ORAL | 3 refills | Status: AC
Start: 2018-03-25 — End: 2018-11-15

## 2018-03-25 MED ORDER — SIMVASTATIN 80 MG PO TAB
80 mg | ORAL_TABLET | Freq: Every evening | ORAL | 3 refills | Status: AC
Start: 2018-03-25 — End: 2018-03-25

## 2018-03-28 ENCOUNTER — Encounter: Admit: 2018-03-28 | Discharge: 2018-03-28 | Payer: MEDICARE

## 2018-03-28 DIAGNOSIS — Z7901 Long term (current) use of anticoagulants: ICD-10-CM

## 2018-03-28 DIAGNOSIS — I480 Paroxysmal atrial fibrillation: Principal | ICD-10-CM

## 2018-03-28 LAB — PROTIME INR (PT): Lab: 2

## 2018-04-04 ENCOUNTER — Encounter: Admit: 2018-04-04 | Discharge: 2018-04-04 | Payer: MEDICARE

## 2018-04-04 DIAGNOSIS — I4891 Unspecified atrial fibrillation: Principal | ICD-10-CM

## 2018-04-04 DIAGNOSIS — Z7901 Long term (current) use of anticoagulants: ICD-10-CM

## 2018-04-04 DIAGNOSIS — I480 Paroxysmal atrial fibrillation: ICD-10-CM

## 2018-04-04 LAB — PROTIME INR (PT): Lab: 2

## 2018-04-12 ENCOUNTER — Encounter: Admit: 2018-04-12 | Discharge: 2018-04-12 | Payer: MEDICARE

## 2018-04-12 DIAGNOSIS — I480 Paroxysmal atrial fibrillation: ICD-10-CM

## 2018-04-12 DIAGNOSIS — Z7901 Long term (current) use of anticoagulants: ICD-10-CM

## 2018-04-12 DIAGNOSIS — I4891 Unspecified atrial fibrillation: Principal | ICD-10-CM

## 2018-04-12 LAB — PROTIME INR (PT): Lab: 1.5

## 2018-04-19 ENCOUNTER — Encounter: Admit: 2018-04-19 | Discharge: 2018-04-19 | Payer: MEDICARE

## 2018-04-19 DIAGNOSIS — Z7901 Long term (current) use of anticoagulants: ICD-10-CM

## 2018-04-19 DIAGNOSIS — I480 Paroxysmal atrial fibrillation: Principal | ICD-10-CM

## 2018-04-19 DIAGNOSIS — I4891 Unspecified atrial fibrillation: Principal | ICD-10-CM

## 2018-04-19 LAB — PROTIME INR (PT): Lab: 2.2

## 2018-05-02 ENCOUNTER — Encounter: Admit: 2018-05-02 | Discharge: 2018-05-02 | Payer: MEDICARE

## 2018-05-02 LAB — PROTIME INR (PT): Lab: 3.9

## 2018-05-09 ENCOUNTER — Encounter: Admit: 2018-05-09 | Discharge: 2018-05-09 | Payer: MEDICARE

## 2018-05-09 DIAGNOSIS — I480 Paroxysmal atrial fibrillation: Principal | ICD-10-CM

## 2018-05-09 DIAGNOSIS — Z7901 Long term (current) use of anticoagulants: ICD-10-CM

## 2018-05-09 LAB — PROTIME INR (PT): Lab: 4

## 2018-05-16 ENCOUNTER — Encounter: Admit: 2018-05-16 | Discharge: 2018-05-16 | Payer: MEDICARE

## 2018-05-16 DIAGNOSIS — Z7901 Long term (current) use of anticoagulants: ICD-10-CM

## 2018-05-16 DIAGNOSIS — I4891 Unspecified atrial fibrillation: Principal | ICD-10-CM

## 2018-05-23 LAB — PROTIME INR (PT): Lab: 2.1 % (ref >60)

## 2018-05-24 ENCOUNTER — Encounter: Admit: 2018-05-24 | Discharge: 2018-05-24 | Payer: MEDICARE

## 2018-05-24 DIAGNOSIS — I480 Paroxysmal atrial fibrillation: Secondary | ICD-10-CM

## 2018-05-24 DIAGNOSIS — Z7901 Long term (current) use of anticoagulants: Secondary | ICD-10-CM

## 2018-05-30 ENCOUNTER — Encounter: Admit: 2018-05-30 | Discharge: 2018-05-30 | Payer: MEDICARE

## 2018-05-30 DIAGNOSIS — I4891 Unspecified atrial fibrillation: Secondary | ICD-10-CM

## 2018-05-30 DIAGNOSIS — Z7901 Long term (current) use of anticoagulants: Secondary | ICD-10-CM

## 2018-05-30 DIAGNOSIS — I480 Paroxysmal atrial fibrillation: Secondary | ICD-10-CM

## 2018-05-30 LAB — PROTIME INR (PT): Lab: 2.7

## 2018-06-06 LAB — PROTIME INR (PT): Lab: 2.5

## 2018-06-07 ENCOUNTER — Encounter: Admit: 2018-06-07 | Discharge: 2018-06-07 | Payer: MEDICARE

## 2018-06-07 DIAGNOSIS — Z7901 Long term (current) use of anticoagulants: Secondary | ICD-10-CM

## 2018-06-07 DIAGNOSIS — I480 Paroxysmal atrial fibrillation: Secondary | ICD-10-CM

## 2018-06-13 ENCOUNTER — Encounter: Admit: 2018-06-13 | Discharge: 2018-06-13 | Payer: MEDICARE

## 2018-06-13 DIAGNOSIS — I480 Paroxysmal atrial fibrillation: Secondary | ICD-10-CM

## 2018-06-13 DIAGNOSIS — I4891 Unspecified atrial fibrillation: Secondary | ICD-10-CM

## 2018-06-13 DIAGNOSIS — Z7901 Long term (current) use of anticoagulants: Secondary | ICD-10-CM

## 2018-06-13 LAB — PROTIME INR (PT): Lab: 2.2

## 2018-06-16 ENCOUNTER — Encounter: Admit: 2018-06-16 | Discharge: 2018-06-16 | Payer: MEDICARE

## 2018-06-17 MED ORDER — LOSARTAN 100 MG PO TAB
ORAL_TABLET | Freq: Every day | ORAL | 4 refills | 90.00000 days | Status: AC
Start: 2018-06-17 — End: 2018-11-15

## 2018-06-20 ENCOUNTER — Encounter: Admit: 2018-06-20 | Discharge: 2018-06-20 | Payer: MEDICARE

## 2018-06-20 DIAGNOSIS — I4891 Unspecified atrial fibrillation: Principal | ICD-10-CM

## 2018-06-20 LAB — PROTIME INR (PT): Lab: 2.1

## 2018-06-27 LAB — PROTIME INR (PT): Lab: 2.2 g/dL (ref 13.5–16.5)

## 2018-06-28 ENCOUNTER — Encounter: Admit: 2018-06-28 | Discharge: 2018-06-28 | Payer: MEDICARE

## 2018-06-28 DIAGNOSIS — I480 Paroxysmal atrial fibrillation: Principal | ICD-10-CM

## 2018-07-05 ENCOUNTER — Encounter: Admit: 2018-07-05 | Discharge: 2018-07-05 | Payer: MEDICARE

## 2018-07-11 ENCOUNTER — Encounter: Admit: 2018-07-11 | Discharge: 2018-07-11 | Payer: MEDICARE

## 2018-07-18 ENCOUNTER — Encounter: Admit: 2018-07-18 | Discharge: 2018-07-18 | Payer: MEDICARE

## 2018-07-18 LAB — PROTIME INR (PT): Lab: 2.9

## 2018-07-26 ENCOUNTER — Encounter: Admit: 2018-07-26 | Discharge: 2018-07-26 | Payer: MEDICARE

## 2018-08-01 ENCOUNTER — Encounter: Admit: 2018-08-01 | Discharge: 2018-08-01 | Payer: MEDICARE

## 2018-08-03 ENCOUNTER — Encounter: Admit: 2018-08-03 | Discharge: 2018-08-03 | Payer: MEDICARE

## 2018-08-03 LAB — PROTIME INR (PT): Lab: 2 U/L (ref 7–40)

## 2018-08-15 ENCOUNTER — Encounter: Admit: 2018-08-15 | Discharge: 2018-08-15 | Payer: MEDICARE

## 2018-08-15 LAB — PROTIME INR (PT): Lab: 2

## 2018-08-23 ENCOUNTER — Encounter: Admit: 2018-08-23 | Discharge: 2018-08-23 | Payer: MEDICARE

## 2018-08-23 DIAGNOSIS — I4891 Unspecified atrial fibrillation: Principal | ICD-10-CM

## 2018-08-24 ENCOUNTER — Encounter: Admit: 2018-08-24 | Discharge: 2018-08-24 | Payer: MEDICARE

## 2018-08-24 DIAGNOSIS — I4891 Unspecified atrial fibrillation: Principal | ICD-10-CM

## 2018-08-29 ENCOUNTER — Encounter: Admit: 2018-08-29 | Discharge: 2018-08-29 | Payer: MEDICARE

## 2018-08-29 DIAGNOSIS — I4891 Unspecified atrial fibrillation: Principal | ICD-10-CM

## 2018-08-29 LAB — PROTIME INR (PT): Lab: 2.6

## 2018-09-06 ENCOUNTER — Encounter: Admit: 2018-09-06 | Discharge: 2018-09-06 | Payer: MEDICARE

## 2018-09-06 DIAGNOSIS — I4891 Unspecified atrial fibrillation: Principal | ICD-10-CM

## 2018-09-06 LAB — PROTIME INR (PT): Lab: 2.8

## 2018-09-12 ENCOUNTER — Encounter: Admit: 2018-09-12 | Discharge: 2018-09-12 | Payer: MEDICARE

## 2018-09-12 DIAGNOSIS — I4891 Unspecified atrial fibrillation: Principal | ICD-10-CM

## 2018-09-12 LAB — PROTIME INR (PT): Lab: 2.9

## 2018-09-20 ENCOUNTER — Encounter: Admit: 2018-09-20 | Discharge: 2018-09-20 | Payer: MEDICARE

## 2018-09-20 LAB — PROTIME INR (PT): Lab: 2.2

## 2018-09-30 ENCOUNTER — Encounter: Admit: 2018-09-30 | Discharge: 2018-09-30 | Payer: MEDICARE

## 2018-09-30 LAB — PROTIME INR (PT): Lab: 2.4

## 2018-10-03 ENCOUNTER — Encounter: Admit: 2018-10-03 | Discharge: 2018-10-03 | Payer: MEDICARE

## 2018-10-03 DIAGNOSIS — I4891 Unspecified atrial fibrillation: Principal | ICD-10-CM

## 2018-10-03 LAB — PROTIME INR (PT): Lab: 2.9 10*3/uL (ref 3–12)

## 2018-10-11 ENCOUNTER — Encounter: Admit: 2018-10-11 | Discharge: 2018-10-11 | Payer: MEDICARE

## 2018-10-11 DIAGNOSIS — I4891 Unspecified atrial fibrillation: Principal | ICD-10-CM

## 2018-10-11 LAB — PROTIME INR (PT): Lab: 2.1

## 2018-10-17 ENCOUNTER — Encounter: Admit: 2018-10-17 | Discharge: 2018-10-17

## 2018-10-17 DIAGNOSIS — I4891 Unspecified atrial fibrillation: Secondary | ICD-10-CM

## 2018-10-17 NOTE — Progress Notes
INR 1.9, no missed doses, take 6mg  today, then same dose and recheck in a week. tc

## 2018-10-24 ENCOUNTER — Encounter: Admit: 2018-10-24 | Discharge: 2018-10-24

## 2018-10-24 DIAGNOSIS — I4891 Unspecified atrial fibrillation: Secondary | ICD-10-CM

## 2018-10-24 LAB — PROTIME INR (PT): Lab: 2

## 2018-11-01 ENCOUNTER — Encounter: Admit: 2018-11-01 | Discharge: 2018-11-01

## 2018-11-01 NOTE — Progress Notes
Patient gets INR every week on home monitor and has requested we do not call unless we make a dose change.  INR is therapeutic and do not plan to change dosing at this time.  Will plan to see another INR in a week.

## 2018-11-13 LAB — PROTIME INR (PT): Lab: 2.3

## 2018-11-14 ENCOUNTER — Encounter: Admit: 2018-11-14 | Discharge: 2018-11-14

## 2018-11-14 DIAGNOSIS — I4891 Unspecified atrial fibrillation: Secondary | ICD-10-CM

## 2018-11-14 LAB — PROTIME INR (PT): Lab: 2.1

## 2018-11-15 ENCOUNTER — Encounter: Admit: 2018-11-15 | Discharge: 2018-11-15

## 2018-11-15 ENCOUNTER — Ambulatory Visit: Admit: 2018-11-15 | Discharge: 2018-11-16

## 2018-11-15 DIAGNOSIS — R079 Chest pain, unspecified: Secondary | ICD-10-CM

## 2018-11-15 DIAGNOSIS — E7800 Pure hypercholesterolemia, unspecified: Secondary | ICD-10-CM

## 2018-11-15 DIAGNOSIS — R9439 Abnormal result of other cardiovascular function study: Secondary | ICD-10-CM

## 2018-11-15 DIAGNOSIS — I2510 Atherosclerotic heart disease of native coronary artery without angina pectoris: Secondary | ICD-10-CM

## 2018-11-15 DIAGNOSIS — Z7901 Long term (current) use of anticoagulants: Secondary | ICD-10-CM

## 2018-11-15 DIAGNOSIS — I10 Essential (primary) hypertension: Secondary | ICD-10-CM

## 2018-11-15 DIAGNOSIS — I4891 Unspecified atrial fibrillation: Secondary | ICD-10-CM

## 2018-11-15 DIAGNOSIS — G4730 Sleep apnea, unspecified: Secondary | ICD-10-CM

## 2018-11-15 DIAGNOSIS — E669 Obesity, unspecified: Secondary | ICD-10-CM

## 2018-11-15 DIAGNOSIS — E119 Type 2 diabetes mellitus without complications: Secondary | ICD-10-CM

## 2018-11-15 DIAGNOSIS — R600 Localized edema: Secondary | ICD-10-CM

## 2018-11-15 DIAGNOSIS — E782 Mixed hyperlipidemia: Secondary | ICD-10-CM

## 2018-11-15 DIAGNOSIS — E1169 Type 2 diabetes mellitus with other specified complication: Secondary | ICD-10-CM

## 2018-11-15 DIAGNOSIS — E785 Hyperlipidemia, unspecified: Secondary | ICD-10-CM

## 2018-11-15 DIAGNOSIS — G4733 Obstructive sleep apnea (adult) (pediatric): Secondary | ICD-10-CM

## 2018-11-15 DIAGNOSIS — I480 Paroxysmal atrial fibrillation: Secondary | ICD-10-CM

## 2018-11-15 DIAGNOSIS — F17200 Nicotine dependence, unspecified, uncomplicated: Secondary | ICD-10-CM

## 2018-11-15 DIAGNOSIS — N189 Chronic kidney disease, unspecified: Secondary | ICD-10-CM

## 2018-11-15 DIAGNOSIS — R0602 Shortness of breath: Secondary | ICD-10-CM

## 2018-11-15 DIAGNOSIS — E118 Type 2 diabetes mellitus with unspecified complications: Secondary | ICD-10-CM

## 2018-11-15 DIAGNOSIS — Z87891 Personal history of nicotine dependence: Secondary | ICD-10-CM

## 2018-11-15 DIAGNOSIS — Z79899 Other long term (current) drug therapy: Secondary | ICD-10-CM

## 2018-11-15 MED ORDER — WARFARIN 4 MG PO TAB
ORAL_TABLET | Freq: Every day | ORAL | 3 refills | 90.00000 days | Status: DC
Start: 2018-11-15 — End: 2019-10-23

## 2018-11-15 MED ORDER — FUROSEMIDE 40 MG PO TAB
ORAL_TABLET | ORAL | 3 refills | 90.00000 days | Status: DC
Start: 2018-11-15 — End: 2019-10-23

## 2018-11-15 MED ORDER — SIMVASTATIN 80 MG PO TAB
80 mg | ORAL_TABLET | Freq: Every evening | ORAL | 3 refills | Status: DC
Start: 2018-11-15 — End: 2019-11-22

## 2018-11-15 MED ORDER — LOSARTAN 100 MG PO TAB
100 mg | ORAL_TABLET | Freq: Every day | ORAL | 3 refills | 90.00000 days | Status: AC
Start: 2018-11-15 — End: ?

## 2018-11-15 MED ORDER — DOFETILIDE 125 MCG PO CAP
125 ug | ORAL_CAPSULE | Freq: Two times a day (BID) | ORAL | 11 refills | Status: DC
Start: 2018-11-15 — End: 2018-11-15

## 2018-11-15 MED ORDER — METOPROLOL TARTRATE 50 MG PO TAB
50 mg | ORAL_TABLET | Freq: Two times a day (BID) | ORAL | 4 refills | 90.00000 days | Status: AC
Start: 2018-11-15 — End: ?

## 2018-11-15 MED ORDER — DOFETILIDE 125 MCG PO CAP
125 ug | ORAL_CAPSULE | Freq: Two times a day (BID) | ORAL | 3 refills | Status: DC
Start: 2018-11-15 — End: 2019-10-24

## 2018-11-15 NOTE — Progress Notes
Date of Service: 11/15/2018    Douglas Fernandez is a 78 y.o. male.       HPI     Patient is a 78 year old white male with a history of stable ischemic heart disease, patient does have CAD and underwent previous PCI of the RCA on 10/27/2009 and also to LAD previously.  In addition he does have atrial fibrillation, patient has been on Tikosyn therapy, he is anticoagulated with a vitamin K antagonist.  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    Patient states that he is doing well from a cardiac standpoint without any symptoms of chest pain, no heart palpitations, no presyncope or syncope.  He has not taken any sublingual nitroglycerin.    Patient does have chronic bilateral lower extremity edema and he uses compression stockings.    The most recent laboratory work available for my review is dated 02/24/2018 and it demonstrated creatinine of 1.94 mg/dL, the calculated GFR is 57.77 mL/min.  Patient is currently on dofetilide 250 mg p.o. twice daily.         Vitals:    11/15/18 1313 11/15/18 1328   BP: (!) 152/76 (!) 150/78   BP Source: Arm, Left Upper Arm, Right Upper   Pulse: 62    SpO2: 96%    Weight: 125.9 kg (277 lb 9.6 oz)    Height: 1.753 m (5' 9)    PainSc: Zero      Body mass index is 40.99 kg/m???.     Past Medical History  Patient Active Problem List    Diagnosis Date Noted   ??? Pre-operative cardiovascular examination 02/04/2017   ??? On dofetilide therapy 08/20/2016   ??? Warfarin anticoagulation 08/20/2016   ??? Bilateral edema of lower extremity 05/08/2014   ??? Sleep apnea 12/06/2012   ??? Renal failure 08/25/2012   ??? Atrial fibrillation (HCC) 06/30/2012     06/24/2012 - Symptoms of DOE, AF on EKG.  07/28/2012 - TEE and DCCV:  Successful DC cardioversion of Atrial fibrillation to sinus rhythm.  08/25/2012 - Pt in AF, Multaq stopped. 11/21/2012 - Hospital admission for Tikosyn initiation, DCCV to restore NSR.     ??? History of tobacco abuse 06/30/2012   ??? Dyspnea 12/19/2010   ??? Chest pain 10/15/2009   ??? Type II diabetes mellitus (HCC) 10/15/2009   ??? Angina pectoris (HCC) 11/25/2006   ??? CAD (coronary artery disease) 11/25/2006     11/2006:  DES to mid and distal LAD after abnormal stress testing, done for chest discomfort  10/09 :   DES to mid-LAD after presentation with ACS related to restenosis.  Noted to have 60% distal RCA stenosis at that time.  01/11/2011 - Stress Test:  This is a normal study.  There are no fixed or reversible perfusion abnormalities identified.  The LV function is preserved with an EF calculated at 67 percent.  The ECG portion of this study is unremarkable and not suggestive of ischemia.  No high risk prognostic indicators are associated with this study.       ??? Abnormal cardiovascular stress test    ??? Obesity    ??? Hypertension    ??? Hyperlipidemia          Review of Systems   Constitution: Negative.   HENT: Positive for hearing loss.    Eyes: Positive for redness.   Cardiovascular: Positive  for claudication, dyspnea on exertion, leg swelling and paroxysmal nocturnal dyspnea.   Respiratory: Positive for shortness of breath, sleep disturbances due to breathing and wheezing.    Endocrine: Positive for polyuria.   Hematologic/Lymphatic: Negative.    Skin: Positive for dry skin and poor wound healing.   Musculoskeletal: Positive for arthritis, back pain, joint pain, joint swelling and muscle weakness.   Gastrointestinal: Negative.    Genitourinary: Negative.    Neurological: Negative.    Psychiatric/Behavioral: Negative.    Allergic/Immunologic: Negative.        Physical Exam  General Appearance: obese  Skin: warm, moist, no ulcers or xanthomas  Eyes: conjunctivae and lids normal, pupils are equal and round  Lips & Oral Mucosa: no pallor or cyanosis  Neck Veins: neck veins are flat, neck veins are not distended Chest Inspection: chest is normal in appearance  Respiratory Effort: breathing comfortably, no respiratory distress  Auscultation/Percussion: lungs clear to auscultation, no rales or rhonchi, no wheezing  Cardiac Rhythm: regular rhythm and normal rate  Cardiac Auscultation: S1, S2 normal, no rub, no gallop  Murmurs: no murmur  Carotid Arteries: normal carotid upstroke bilaterally, no bruit  Abdominal aorta: could not be examined due to obese adomen  Lower Extremity Edema: mild bilateral lower extremity edema  Abdominal Exam: soft, non-tender, no masses, bowel sounds normal  Liver & Spleen: no organomegaly  Language and Memory: patient responsive and seems to comprehend information  Neurologic Exam: neurological assessment grossly intact    Cardiovascular Studies  Twelve-lead EKG demonstrates normal sinus rhythm, QT segment 428 ms, QTC 439 ms.    Problems Addressed Today  Encounter Diagnoses   Name Primary?   ??? Essential hypertension Yes   ??? Mixed hyperlipidemia    ??? Coronary artery disease involving native heart without angina pectoris, unspecified vessel or lesion type    ??? Atrial fibrillation, unspecified type (HCC)    ??? Warfarin anticoagulation    ??? Type 2 diabetes mellitus with other specified complication, without long-term current use of insulin (HCC)    ??? Obstructive sleep apnea syndrome    ??? On dofetilide therapy    ??? Bilateral edema of lower extremity        Assessment and Plan     In summary: This is a 78 year old white male with stable ischemic heart disease patient did undergo PCI to RCA in June 2011 and prior to that he underwent PCI to LAD, patient has not been experiencing any symptoms of chest pain and has not taken any sublingual nitroglycerin.  He does have a history of paroxysmal atrial fibrillation and has been currently anticoagulated with a VKA and he is also on dofetilide, his creatinine clearance is 55.77 mL/min.    In addition patient does have sleep apnea, he is morbidly obese, he also has COPD.    Plan:    1.  Although his creatinine clearance is still acceptable, due to an elevated creatinine I did suggest to decrease the dofetilide 225 mg p.o. twice daily.  2.  Continue all other medications, we did issue refills today.  3.  I did advise the patient on a low-salt diet and overall risk factors modification  4.  Follow-up office visit in approximately 8 to 10 months.         Current Medications (including today's revisions)  ??? albuterol (VENTOLIN HFA) 90 mcg/actuation inhaler Inhale 2 puffs by mouth into the lungs every 6 hours as needed for Wheezing or Shortness of Breath. Shake well before use.   ???  budesonide/formoterol (SYMBICORT) 160/4.5 mcg HFAA inhalation Inhale 2 Puffs by mouth twice daily.     ??? clindamycin(+) (CLEOCIN) 300 mg capsule Take 1 capsule by mouth twice daily.   ??? dofetilide (TIKOSYN) 250 mcg capsule Take 1 capsule by mouth twice daily.   ??? fenofibrate micronized (LOFIBRA) 134 mg capsule TAKE 1 CAPSULE DAILY BEFORE BREAKFAST   ??? furosemide (LASIX) 40 mg tablet TAKE 2 TABLETS IN THE MORNING AND TAKE 1 TABLET IN THE EVENING   ??? gabapentin (NEURONTIN) 300 mg capsule Take 2 capsules by mouth at bedtime daily.   ??? insulin glargine (LANTUS) 100 unit/mL injection Inject 60 Units under the skin at bedtime daily. 1 vial contains 10mL   ??? losartan (COZAAR) 100 mg tablet TAKE 1 TABLET DAILY   ??? magnesium oxide (MAG-OX) 400 mg tablet TAKE 1 TABLET BY MOUTH TWICE A DAY   ??? metFORMIN (GLUCOPHAGE) 1,000 mg tablet Take 1,000 mg by mouth twice daily with meals.   ??? metoprolol tartrate (LOPRESSOR) 50 mg tablet TAKE 1 TABLET TWICE A DAY   ??? nitroglycerin (NITROSTAT) 0.4 mg tablet Place 0.4 mg under tongue every 5 minutes as needed.   ??? omeprazole DR(+) (PRILOSEC) 20 mg PO capsule Take 20 mg by mouth at bedtime daily.   ??? potassium chloride SR (K-DUR) 10 mEq tablet Take 10 mEq by mouth twice daily. Take with a meal and a full glass of water.   ??? SANTYL 250 unit/gram topical ointment ??? simvastatin (ZOCOR) 80 mg tablet Take one tablet by mouth at bedtime daily.   ??? SYNTHROID 75 mcg tablet Take 1 tablet by mouth daily.   ??? tiotropium (SPIRIVA WITH HANDIHALER) 18 mcg capsule for inhaler Place 18 mcg into inhaler and inhale into lungs as directed daily.   ??? warfarin (COUMADIN) 4 mg tablet TAKE 1 TO 2 TABLETS DAILY AS DIRECTED BY [facility]

## 2018-11-22 ENCOUNTER — Encounter: Admit: 2018-11-22 | Discharge: 2018-11-22

## 2018-11-22 DIAGNOSIS — I4891 Unspecified atrial fibrillation: Secondary | ICD-10-CM

## 2018-11-28 ENCOUNTER — Encounter: Admit: 2018-11-28 | Discharge: 2018-11-28

## 2018-11-28 DIAGNOSIS — I4891 Unspecified atrial fibrillation: Secondary | ICD-10-CM

## 2018-11-28 NOTE — Progress Notes
11/28/2018 3:47 PM INR WNL, do not call within range. Asencion Partridge Eulanda Dorion-RN

## 2018-12-05 ENCOUNTER — Encounter: Admit: 2018-12-05 | Discharge: 2018-12-05

## 2018-12-05 DIAGNOSIS — I4891 Unspecified atrial fibrillation: Secondary | ICD-10-CM

## 2018-12-12 ENCOUNTER — Encounter: Admit: 2018-12-12 | Discharge: 2018-12-12

## 2018-12-12 DIAGNOSIS — I4891 Unspecified atrial fibrillation: Secondary | ICD-10-CM

## 2018-12-12 NOTE — Progress Notes
12/12/2018 10:57 AM   Patient gets INR every week on home monitor and has requested we do not call unless we make a dose change.  INR is therapeutic and do not plan to change dosing at this time.  Will plan to see another INR in a week.  Wilder Glade, LPN

## 2018-12-19 LAB — PROTIME INR (PT): Lab: 2

## 2018-12-20 ENCOUNTER — Encounter: Admit: 2018-12-20 | Discharge: 2018-12-20

## 2018-12-20 DIAGNOSIS — I4891 Unspecified atrial fibrillation: Secondary | ICD-10-CM

## 2018-12-28 ENCOUNTER — Encounter: Admit: 2018-12-28 | Discharge: 2018-12-28

## 2019-01-02 LAB — PROTIME INR (PT): Lab: 2

## 2019-01-05 ENCOUNTER — Encounter: Admit: 2019-01-05 | Discharge: 2019-01-05

## 2019-01-05 DIAGNOSIS — I4891 Unspecified atrial fibrillation: Secondary | ICD-10-CM

## 2019-01-09 ENCOUNTER — Encounter: Admit: 2019-01-09 | Discharge: 2019-01-09

## 2019-01-09 DIAGNOSIS — I4891 Unspecified atrial fibrillation: Secondary | ICD-10-CM

## 2019-01-16 ENCOUNTER — Encounter: Admit: 2019-01-16 | Discharge: 2019-01-16

## 2019-01-16 DIAGNOSIS — I4891 Unspecified atrial fibrillation: Secondary | ICD-10-CM

## 2019-01-24 ENCOUNTER — Encounter: Admit: 2019-01-24 | Discharge: 2019-01-24

## 2019-01-24 DIAGNOSIS — I4891 Unspecified atrial fibrillation: Secondary | ICD-10-CM

## 2019-01-24 LAB — PROTIME INR (PT): Lab: 2.4

## 2019-01-30 ENCOUNTER — Encounter: Admit: 2019-01-30 | Discharge: 2019-01-30 | Payer: MEDICARE

## 2019-01-30 LAB — PROTIME INR (PT): Lab: 2.6 mg/dL — ABNORMAL HIGH (ref 0.4–1.24)

## 2019-02-06 LAB — PROTIME INR (PT): Lab: 2

## 2019-02-07 ENCOUNTER — Encounter: Admit: 2019-02-07 | Discharge: 2019-02-07 | Payer: MEDICARE

## 2019-02-08 ENCOUNTER — Encounter: Admit: 2019-02-08 | Discharge: 2019-02-08 | Payer: MEDICARE

## 2019-02-14 ENCOUNTER — Encounter

## 2019-02-14 DIAGNOSIS — I4891 Unspecified atrial fibrillation: Secondary | ICD-10-CM

## 2019-02-21 ENCOUNTER — Encounter: Admit: 2019-02-21 | Discharge: 2019-02-21 | Payer: MEDICARE

## 2019-02-21 LAB — PROTIME INR (PT): Lab: 2.5

## 2019-02-21 NOTE — Progress Notes
02/21/2019 1:20 PM   Patient gets INR every week on home monitor and has requested we do not call unless we make a dose change.  INR is therapeutic and do not plan to change dosing at this time.  Will plan to see another INR in a week.  Wilder Glade, LPN

## 2019-02-28 ENCOUNTER — Encounter: Admit: 2019-02-28 | Discharge: 2019-02-28 | Payer: MEDICARE

## 2019-02-28 DIAGNOSIS — I4891 Unspecified atrial fibrillation: Secondary | ICD-10-CM

## 2019-02-28 LAB — PROTIME INR (PT): Lab: 2.5

## 2019-03-01 ENCOUNTER — Encounter: Admit: 2019-03-01 | Discharge: 2019-03-01 | Payer: MEDICARE

## 2019-03-06 ENCOUNTER — Encounter: Admit: 2019-03-06 | Discharge: 2019-03-06 | Payer: MEDICARE

## 2019-03-06 DIAGNOSIS — I4891 Unspecified atrial fibrillation: Secondary | ICD-10-CM

## 2019-03-06 LAB — PROTIME INR (PT): Lab: 2.1

## 2019-03-07 ENCOUNTER — Encounter: Admit: 2019-03-07 | Discharge: 2019-03-07 | Payer: MEDICARE

## 2019-03-07 DIAGNOSIS — I4891 Unspecified atrial fibrillation: Secondary | ICD-10-CM

## 2019-03-13 ENCOUNTER — Encounter: Admit: 2019-03-13 | Discharge: 2019-03-13 | Payer: MEDICARE

## 2019-03-13 DIAGNOSIS — I4891 Unspecified atrial fibrillation: Secondary | ICD-10-CM

## 2019-03-22 ENCOUNTER — Encounter: Admit: 2019-03-22 | Discharge: 2019-03-22 | Payer: MEDICARE

## 2019-03-22 DIAGNOSIS — I4891 Unspecified atrial fibrillation: Secondary | ICD-10-CM

## 2019-03-27 ENCOUNTER — Encounter: Admit: 2019-03-27 | Discharge: 2019-03-27 | Payer: MEDICARE

## 2019-03-27 DIAGNOSIS — I4891 Unspecified atrial fibrillation: Secondary | ICD-10-CM

## 2019-03-27 LAB — PROTIME INR (PT): Lab: 2.1 g/dL (ref 3.5–5.0)

## 2019-04-04 ENCOUNTER — Encounter: Admit: 2019-04-04 | Discharge: 2019-04-04 | Payer: MEDICARE

## 2019-04-04 DIAGNOSIS — I4891 Unspecified atrial fibrillation: Secondary | ICD-10-CM

## 2019-04-04 LAB — PROTIME INR (PT): Lab: 2.2 % (ref 11–15)

## 2019-04-11 ENCOUNTER — Encounter: Admit: 2019-04-11 | Discharge: 2019-04-11 | Payer: MEDICARE

## 2019-04-11 DIAGNOSIS — I4891 Unspecified atrial fibrillation: Secondary | ICD-10-CM

## 2019-04-25 ENCOUNTER — Encounter: Admit: 2019-04-25 | Discharge: 2019-04-25 | Payer: MEDICARE

## 2019-04-25 DIAGNOSIS — I4891 Unspecified atrial fibrillation: Secondary | ICD-10-CM

## 2019-04-25 LAB — PROTIME INR (PT): Lab: 3

## 2019-05-01 ENCOUNTER — Encounter: Admit: 2019-05-01 | Discharge: 2019-05-01 | Payer: MEDICARE

## 2019-05-01 DIAGNOSIS — I4891 Unspecified atrial fibrillation: Secondary | ICD-10-CM

## 2019-05-08 ENCOUNTER — Encounter: Admit: 2019-05-08 | Discharge: 2019-05-08 | Payer: MEDICARE

## 2019-05-08 DIAGNOSIS — I4891 Unspecified atrial fibrillation: Secondary | ICD-10-CM

## 2019-05-08 LAB — PROTIME INR (PT): Lab: 2

## 2019-05-15 ENCOUNTER — Encounter: Admit: 2019-05-15 | Discharge: 2019-05-15 | Payer: MEDICARE

## 2019-05-15 DIAGNOSIS — I4891 Unspecified atrial fibrillation: Secondary | ICD-10-CM

## 2019-05-15 LAB — PROTIME INR (PT): Lab: 2

## 2019-05-23 ENCOUNTER — Encounter: Admit: 2019-05-23 | Discharge: 2019-05-23 | Payer: MEDICARE

## 2019-05-23 DIAGNOSIS — I4891 Unspecified atrial fibrillation: Secondary | ICD-10-CM

## 2019-05-23 LAB — PROTIME INR (PT): Lab: 2

## 2019-05-29 ENCOUNTER — Encounter: Admit: 2019-05-29 | Discharge: 2019-05-29 | Payer: MEDICARE

## 2019-05-29 DIAGNOSIS — I4891 Unspecified atrial fibrillation: Secondary | ICD-10-CM

## 2019-06-06 ENCOUNTER — Encounter: Admit: 2019-06-06 | Discharge: 2019-06-06 | Payer: MEDICARE

## 2019-06-06 DIAGNOSIS — I4891 Unspecified atrial fibrillation: Secondary | ICD-10-CM

## 2019-06-13 ENCOUNTER — Encounter: Admit: 2019-06-13 | Discharge: 2019-06-13 | Payer: MEDICARE

## 2019-06-13 DIAGNOSIS — I4891 Unspecified atrial fibrillation: Secondary | ICD-10-CM

## 2019-06-13 LAB — PROTIME INR (PT): Lab: 2

## 2019-06-20 ENCOUNTER — Encounter: Admit: 2019-06-20 | Discharge: 2019-06-20 | Payer: MEDICARE

## 2019-06-20 DIAGNOSIS — I4891 Unspecified atrial fibrillation: Secondary | ICD-10-CM

## 2019-06-20 LAB — PROTIME INR (PT): Lab: 2.6

## 2019-06-26 ENCOUNTER — Encounter: Admit: 2019-06-26 | Discharge: 2019-06-26 | Payer: MEDICARE

## 2019-06-26 DIAGNOSIS — I4891 Unspecified atrial fibrillation: Secondary | ICD-10-CM

## 2019-06-26 LAB — PROTIME INR (PT): Lab: 2.6

## 2019-07-04 ENCOUNTER — Encounter: Admit: 2019-07-04 | Discharge: 2019-07-04 | Payer: MEDICARE

## 2019-07-04 DIAGNOSIS — I4891 Unspecified atrial fibrillation: Secondary | ICD-10-CM

## 2019-07-11 ENCOUNTER — Encounter: Admit: 2019-07-11 | Discharge: 2019-07-11 | Payer: MEDICARE

## 2019-07-11 DIAGNOSIS — I4891 Unspecified atrial fibrillation: Secondary | ICD-10-CM

## 2019-07-11 LAB — PROTIME INR (PT): Lab: 2.1

## 2019-07-17 ENCOUNTER — Encounter: Admit: 2019-07-17 | Discharge: 2019-07-17 | Payer: MEDICARE

## 2019-07-17 DIAGNOSIS — I4891 Unspecified atrial fibrillation: Secondary | ICD-10-CM

## 2019-07-17 LAB — PROTIME INR (PT): Lab: 2.8 mg/dL (ref 8.5–10.6)

## 2019-07-24 ENCOUNTER — Encounter: Admit: 2019-07-24 | Discharge: 2019-07-24 | Payer: MEDICARE

## 2019-07-24 DIAGNOSIS — I4891 Unspecified atrial fibrillation: Secondary | ICD-10-CM

## 2019-07-24 LAB — PROTIME INR (PT): Lab: 3

## 2019-08-02 ENCOUNTER — Encounter: Admit: 2019-08-02 | Discharge: 2019-08-02 | Payer: MEDICARE

## 2019-08-02 DIAGNOSIS — I4891 Unspecified atrial fibrillation: Secondary | ICD-10-CM

## 2019-08-02 LAB — PROTIME INR (PT): Lab: 2.6 g/dL (ref 6.0–8.0)

## 2019-08-09 ENCOUNTER — Encounter: Admit: 2019-08-09 | Discharge: 2019-08-09 | Payer: MEDICARE

## 2019-08-09 DIAGNOSIS — I4891 Unspecified atrial fibrillation: Secondary | ICD-10-CM

## 2019-08-16 ENCOUNTER — Encounter: Admit: 2019-08-16 | Discharge: 2019-08-16 | Payer: MEDICARE

## 2019-08-16 DIAGNOSIS — I4891 Unspecified atrial fibrillation: Secondary | ICD-10-CM

## 2019-08-16 LAB — PROTIME INR (PT): Lab: 2.6

## 2019-08-23 ENCOUNTER — Encounter: Admit: 2019-08-23 | Discharge: 2019-08-23 | Payer: MEDICARE

## 2019-08-23 DIAGNOSIS — I4891 Unspecified atrial fibrillation: Secondary | ICD-10-CM

## 2019-08-29 ENCOUNTER — Encounter: Admit: 2019-08-29 | Discharge: 2019-08-29 | Payer: MEDICARE

## 2019-08-29 LAB — PROTIME INR (PT): Lab: 2.8

## 2019-09-21 ENCOUNTER — Encounter: Admit: 2019-09-21 | Discharge: 2019-09-21 | Payer: MEDICARE

## 2019-09-25 ENCOUNTER — Encounter: Admit: 2019-09-25 | Discharge: 2019-09-25 | Payer: MEDICARE

## 2019-09-25 DIAGNOSIS — I4891 Unspecified atrial fibrillation: Secondary | ICD-10-CM

## 2019-09-25 LAB — PROTIME INR (PT): Lab: 2

## 2019-09-26 ENCOUNTER — Encounter: Admit: 2019-09-26 | Discharge: 2019-09-26 | Payer: MEDICARE

## 2019-09-28 ENCOUNTER — Encounter: Admit: 2019-09-28 | Discharge: 2019-09-28 | Payer: MEDICARE

## 2019-09-28 DIAGNOSIS — I4891 Unspecified atrial fibrillation: Secondary | ICD-10-CM

## 2019-09-28 DIAGNOSIS — R9439 Abnormal result of other cardiovascular function study: Secondary | ICD-10-CM

## 2019-09-28 DIAGNOSIS — R600 Localized edema: Secondary | ICD-10-CM

## 2019-09-28 DIAGNOSIS — G4730 Sleep apnea, unspecified: Secondary | ICD-10-CM

## 2019-09-28 DIAGNOSIS — Z79899 Other long term (current) drug therapy: Secondary | ICD-10-CM

## 2019-09-28 DIAGNOSIS — Z23 Encounter for immunization: Secondary | ICD-10-CM

## 2019-09-28 DIAGNOSIS — E785 Hyperlipidemia, unspecified: Secondary | ICD-10-CM

## 2019-09-28 DIAGNOSIS — E119 Type 2 diabetes mellitus without complications: Secondary | ICD-10-CM

## 2019-09-28 DIAGNOSIS — I10 Essential (primary) hypertension: Secondary | ICD-10-CM

## 2019-09-28 DIAGNOSIS — I2510 Atherosclerotic heart disease of native coronary artery without angina pectoris: Secondary | ICD-10-CM

## 2019-09-28 DIAGNOSIS — G4733 Obstructive sleep apnea (adult) (pediatric): Secondary | ICD-10-CM

## 2019-09-28 DIAGNOSIS — E1169 Type 2 diabetes mellitus with other specified complication: Secondary | ICD-10-CM

## 2019-09-28 DIAGNOSIS — E669 Obesity, unspecified: Secondary | ICD-10-CM

## 2019-09-28 DIAGNOSIS — F17200 Nicotine dependence, unspecified, uncomplicated: Secondary | ICD-10-CM

## 2019-09-28 DIAGNOSIS — Z7901 Long term (current) use of anticoagulants: Secondary | ICD-10-CM

## 2019-09-28 DIAGNOSIS — E782 Mixed hyperlipidemia: Secondary | ICD-10-CM

## 2019-09-28 NOTE — Progress Notes
Date of Service: 09/28/2019    Douglas Fernandez is a 79 y.o. male.       HPI     Mr. Douglas Fernandez is here today for a follow-up office visit.  He is a very pleasant 79 year old male with a history of stable CAD, no recurrent symptoms of angina, atrial fibrillation and currently maintaining sinus rhythm on dofetilide, renal insufficiency in the past (the dofetilide dose was adjusted).  COPD and obesity with a BMI of 40.61.  Patient also has sleep apnea and he does use a CPAP.  Patient is anticoagulated with a vitamin K antagonist.    He reports symptoms of shortness of breath that occur even with minimal physical activity.  This is a complex and multifactorial symptom in this patient.    He did not have any symptoms compatible with angina.         Vitals:    09/28/19 1544   BP: 128/72   BP Source: Arm, Right Upper   Patient Position: Sitting   Pulse: 73   SpO2: 97%   Weight: 124.7 kg (275 lb)   Height: 1.753 m (5' 9)   PainSc: Zero     Body mass index is 40.61 kg/m?Marland Kitchen     Past Medical History  Patient Active Problem List    Diagnosis Date Noted   ? Pre-operative cardiovascular examination 02/04/2017   ? On dofetilide therapy 08/20/2016   ? Warfarin anticoagulation 08/20/2016   ? Bilateral edema of lower extremity 05/08/2014   ? Sleep apnea 12/06/2012   ? Renal failure 08/25/2012   ? Atrial fibrillation (HCC) 06/30/2012     06/24/2012 - Symptoms of DOE, AF on EKG.  07/28/2012 - TEE and DCCV:  Successful DC cardioversion of Atrial fibrillation to sinus rhythm.  08/25/2012 - Pt in AF, Multaq stopped.  11/21/2012 - Hospital admission for Tikosyn initiation, DCCV to restore NSR.     ? History of tobacco abuse 06/30/2012   ? Dyspnea 12/19/2010   ? Chest pain 10/15/2009   ? Type II diabetes mellitus (HCC) 10/15/2009   ? Angina pectoris (HCC) 11/25/2006   ? CAD (coronary artery disease) 11/25/2006     11/2006:  DES to mid and distal LAD after abnormal stress testing, done for chest discomfort  10/09 :   DES to mid-LAD after presentation with ACS related to restenosis.  Noted to have 60% distal RCA stenosis at that time.  01/11/2011 - Stress Test:  This is a normal study.  There are no fixed or reversible perfusion abnormalities identified.  The LV function is preserved with an EF calculated at 67 percent.  The ECG portion of this study is unremarkable and not suggestive of ischemia.  No high risk prognostic indicators are associated with this study.       ? Abnormal cardiovascular stress test    ? Obesity    ? Hypertension    ? Hyperlipidemia          Review of Systems   Constitution: Negative.   HENT: Negative.    Eyes: Negative.    Cardiovascular: Positive for dyspnea on exertion.   Respiratory: Positive for shortness of breath.    Endocrine: Negative.    Hematologic/Lymphatic: Negative.    Skin: Negative.    Musculoskeletal: Negative.    Gastrointestinal: Negative.    Genitourinary: Negative.    Neurological: Negative.    Psychiatric/Behavioral: Negative.    Allergic/Immunologic: Negative.        Physical Exam  General Appearance: obese  Skin: warm, dry, no ulcers or xanthomas  Eyes: conjunctivae and lids normal, pupils are equal and round  Lips & Oral Mucosa: no pallor or cyanosis  Neck Veins: neck veins are flat, neck veins are not distended  Chest Inspection: chest is normal in appearance  Respiratory Effort: breathing comfortably, no respiratory distress  Auscultation/Percussion: lungs clear to auscultation, no rales or rhonchi, no wheezing  Cardiac Rhythm: regular rhythm and normal rate  Cardiac Auscultation: S1, S2 normal, no rub, no gallop  Murmurs: no murmur  Carotid Arteries: normal carotid upstroke bilaterally, no bruit  Abdominal aorta: could not be examined due to obese adomen  Lower Extremity Edema: no lower extremity edema  Abdominal Exam: soft, non-tender, no masses, bowel sounds normal  Liver & Spleen: no organomegaly  Language and Memory: patient responsive and seems to comprehend information  Neurologic Exam: neurological assessment grossly intact        Cardiovascular Studies  Twelve-lead EKG demonstrates normal sinus rhythm, normal QT/QTc 428 ms / 456 ms.    Problems Addressed Today  Encounter Diagnoses   Name Primary?   ? Essential hypertension Yes   ? Mixed hyperlipidemia    ? Coronary artery disease involving native heart without angina pectoris, unspecified vessel or lesion type    ? Atrial fibrillation, unspecified type (HCC)    ? Class 3 severe obesity due to excess calories with serious comorbidity and body mass index (BMI) of 40.0 to 44.9 in adult [facility])    ? Abnormal cardiovascular stress test    ? Obstructive sleep apnea syndrome    ? Bilateral edema of lower extremity    ? On dofetilide therapy    ? Warfarin anticoagulation    ? COVID-19 vaccine administered        Assessment and Plan     In summary: This is a 79 year old male that presents with symptoms of shortness of breath, this is multifactorial and due to obesity, COPD, physical deconditioning, sleep apnea.    Patient does have a known history of stable CAD, he did undergo PCI to RCA in June 2011, prior PCI to LAD, patient has not experienced any recurrent symptoms of angina.    He also has a history of atrial fibrillation, his dofetilide dose has been adjusted according to the renal function, he is currently on a 125 mg p.o. twice daily and maintaining sinus rhythm.    Plan:    1.  Continue all current cardiac medications  2.  I did advise on the strict low-sodium diet  3.  Advised on overall risk factors modification, low-fat, low cholesterol, low sugar, low carbohydrate low-salt diet  4.  Follow-up office visit in 10 to 12 months.         Current Medications (including today's revisions)  ? albuterol (VENTOLIN HFA) 90 mcg/actuation inhaler Inhale 2 puffs by mouth into the lungs every 6 hours as needed for Wheezing or Shortness of Breath. Shake well before use.   ? budesonide/formoterol (SYMBICORT) 160/4.5 mcg HFAA inhalation Inhale 2 Puffs by mouth twice daily.     ? clindamycin(+) (CLEOCIN) 300 mg capsule Take 1 capsule by mouth twice daily.   ? dofetilide (TIKOSYN) 125 mcg capsule Take one capsule by mouth twice daily.   ? fenofibrate micronized (LOFIBRA) 134 mg capsule TAKE 1 CAPSULE DAILY BEFORE BREAKFAST   ? furosemide (LASIX) 40 mg tablet TAKE 2 TABLETS IN THE MORNING AND TAKE 1 TABLET IN THE EVENING   ? gabapentin (NEURONTIN) 300 mg capsule Take 2  capsules by mouth at bedtime daily.   ? insulin glargine (LANTUS) 100 unit/mL injection Inject 60 Units under the skin at bedtime daily. 1 vial contains 10mL   ? losartan (COZAAR) 100 mg tablet Take one tablet by mouth daily.   ? magnesium oxide (MAG-OX) 400 mg tablet TAKE 1 TABLET BY MOUTH TWICE A DAY   ? metFORMIN (GLUCOPHAGE) 1,000 mg tablet Take 1,000 mg by mouth twice daily with meals.   ? metoprolol tartrate (LOPRESSOR) 50 mg tablet Take one tablet by mouth twice daily.   ? nitroglycerin (NITROSTAT) 0.4 mg tablet Place 0.4 mg under tongue every 5 minutes as needed.   ? omeprazole DR(+) (PRILOSEC) 20 mg PO capsule Take 20 mg by mouth at bedtime daily.   ? potassium chloride SR (K-DUR) 10 mEq tablet Take 10 mEq by mouth twice daily. Take with a meal and a full glass of water.   ? SANTYL 250 unit/gram topical ointment    ? simvastatin (ZOCOR) 80 mg tablet Take one tablet by mouth at bedtime daily.   ? SYNTHROID 75 mcg tablet Take 1 tablet by mouth daily.   ? tiotropium (SPIRIVA WITH HANDIHALER) 18 mcg capsule for inhaler Place 18 mcg into inhaler and inhale into lungs as directed daily.   ? warfarin (COUMADIN) 4 mg tablet TAKE 1-2 TABS DAILY AS DIRECTED BY CARDIOLOGY

## 2019-10-02 ENCOUNTER — Encounter: Admit: 2019-10-02 | Discharge: 2019-10-02 | Payer: MEDICARE

## 2019-10-02 DIAGNOSIS — I4891 Unspecified atrial fibrillation: Secondary | ICD-10-CM

## 2019-10-02 LAB — PROTIME INR (PT): Lab: 2.5

## 2019-10-17 ENCOUNTER — Encounter: Admit: 2019-10-17 | Discharge: 2019-10-17 | Payer: MEDICARE

## 2019-10-17 DIAGNOSIS — I4891 Unspecified atrial fibrillation: Secondary | ICD-10-CM

## 2019-10-22 ENCOUNTER — Encounter: Admit: 2019-10-22 | Discharge: 2019-10-22 | Payer: MEDICARE

## 2019-10-23 ENCOUNTER — Encounter: Admit: 2019-10-23 | Discharge: 2019-10-23 | Payer: MEDICARE

## 2019-10-23 DIAGNOSIS — I4891 Unspecified atrial fibrillation: Secondary | ICD-10-CM

## 2019-10-23 LAB — PROTIME INR (PT): Lab: 2

## 2019-10-23 MED ORDER — WARFARIN 4 MG PO TAB
ORAL_TABLET | Freq: Every day | ORAL | 3 refills | 90.00000 days | Status: AC
Start: 2019-10-23 — End: ?

## 2019-10-23 MED ORDER — FUROSEMIDE 40 MG PO TAB
ORAL_TABLET | ORAL | 3 refills | 90.00000 days | Status: AC
Start: 2019-10-23 — End: ?

## 2019-10-24 ENCOUNTER — Encounter: Admit: 2019-10-24 | Discharge: 2019-10-24 | Payer: MEDICARE

## 2019-10-24 MED ORDER — DOFETILIDE 125 MCG PO CAP
ORAL_CAPSULE | Freq: Two times a day (BID) | 0 refills | Status: AC
Start: 2019-10-24 — End: ?

## 2019-10-31 ENCOUNTER — Encounter: Admit: 2019-10-31 | Discharge: 2019-10-31 | Payer: MEDICARE

## 2019-10-31 DIAGNOSIS — I4891 Unspecified atrial fibrillation: Secondary | ICD-10-CM

## 2019-10-31 LAB — PROTIME INR (PT): Lab: 2.2

## 2019-10-31 NOTE — Progress Notes
10/31/2019 8:39 AM   Patient gets INR every week on home monitor and has requested we do not call unless we make a dose change.  INR is therapeutic and do not plan to change dosing at this time.  Will plan to see another INR in a week.  Colvin Caroli, LPN      -----------------------------------------------  10/23/2019 3:30 PM   INR results within goal range, no dosage adjustments needed at this time. Fulton Reek, RN

## 2019-11-06 ENCOUNTER — Encounter: Admit: 2019-11-06 | Discharge: 2019-11-06 | Payer: MEDICARE

## 2019-11-06 DIAGNOSIS — I4891 Unspecified atrial fibrillation: Secondary | ICD-10-CM

## 2019-11-06 LAB — PROTIME INR (PT): Lab: 2.5

## 2019-11-21 ENCOUNTER — Encounter: Admit: 2019-11-21 | Discharge: 2019-11-21 | Payer: MEDICARE

## 2019-11-21 DIAGNOSIS — I4891 Unspecified atrial fibrillation: Secondary | ICD-10-CM

## 2019-11-21 DIAGNOSIS — I2510 Atherosclerotic heart disease of native coronary artery without angina pectoris: Secondary | ICD-10-CM

## 2019-11-21 LAB — PROTIME INR (PT): Lab: 2

## 2019-11-22 MED ORDER — SIMVASTATIN 80 MG PO TAB
ORAL_TABLET | Freq: Every evening | 3 refills | Status: AC
Start: 2019-11-22 — End: ?

## 2019-11-27 ENCOUNTER — Encounter: Admit: 2019-11-27 | Discharge: 2019-11-27 | Payer: MEDICARE

## 2019-11-27 DIAGNOSIS — I4891 Unspecified atrial fibrillation: Secondary | ICD-10-CM

## 2019-11-27 LAB — PROTIME INR (PT): Lab: 2.2 % — ABNORMAL HIGH (ref 30–42)

## 2019-12-08 ENCOUNTER — Encounter: Admit: 2019-12-08 | Discharge: 2019-12-08 | Payer: MEDICARE

## 2019-12-08 DIAGNOSIS — I4891 Unspecified atrial fibrillation: Secondary | ICD-10-CM

## 2019-12-08 LAB — PROTIME INR (PT): Lab: 2.5

## 2019-12-11 ENCOUNTER — Encounter: Admit: 2019-12-11 | Discharge: 2019-12-11 | Payer: MEDICARE

## 2019-12-11 DIAGNOSIS — I4891 Unspecified atrial fibrillation: Secondary | ICD-10-CM

## 2019-12-11 LAB — PROTIME INR (PT): Lab: 2

## 2019-12-18 ENCOUNTER — Encounter: Admit: 2019-12-18 | Discharge: 2019-12-18 | Payer: MEDICARE

## 2019-12-18 DIAGNOSIS — I4891 Unspecified atrial fibrillation: Secondary | ICD-10-CM

## 2019-12-18 LAB — PROTIME INR (PT): Lab: 2

## 2019-12-25 ENCOUNTER — Encounter: Admit: 2019-12-25 | Discharge: 2019-12-25 | Payer: MEDICARE

## 2019-12-25 DIAGNOSIS — I4891 Unspecified atrial fibrillation: Secondary | ICD-10-CM

## 2019-12-25 LAB — PROTIME INR (PT): Lab: 2

## 2020-01-01 ENCOUNTER — Encounter: Admit: 2020-01-01 | Discharge: 2020-01-01 | Payer: MEDICARE

## 2020-01-01 DIAGNOSIS — I4891 Unspecified atrial fibrillation: Secondary | ICD-10-CM

## 2020-01-01 LAB — PROTIME INR (PT): Lab: 2.1 M/UL — ABNORMAL LOW (ref 4.4–5.5)

## 2020-01-04 ENCOUNTER — Encounter: Admit: 2020-01-04 | Discharge: 2020-01-04 | Payer: MEDICARE

## 2020-01-04 DIAGNOSIS — I4891 Unspecified atrial fibrillation: Secondary | ICD-10-CM

## 2020-01-09 ENCOUNTER — Encounter: Admit: 2020-01-09 | Discharge: 2020-01-09 | Payer: MEDICARE

## 2020-01-09 DIAGNOSIS — I4891 Unspecified atrial fibrillation: Secondary | ICD-10-CM

## 2020-01-09 LAB — PROTIME INR (PT): Lab: 2.7

## 2020-01-15 ENCOUNTER — Encounter: Admit: 2020-01-15 | Discharge: 2020-01-15 | Payer: MEDICARE

## 2020-01-15 DIAGNOSIS — I4891 Unspecified atrial fibrillation: Secondary | ICD-10-CM

## 2020-01-15 LAB — PROTIME INR (PT): Lab: 2.5

## 2020-01-22 ENCOUNTER — Encounter: Admit: 2020-01-22 | Discharge: 2020-01-22 | Payer: MEDICARE

## 2020-01-22 DIAGNOSIS — R9439 Abnormal result of other cardiovascular function study: Secondary | ICD-10-CM

## 2020-01-22 DIAGNOSIS — N189 Chronic kidney disease, unspecified: Secondary | ICD-10-CM

## 2020-01-22 DIAGNOSIS — Z87891 Personal history of nicotine dependence: Secondary | ICD-10-CM

## 2020-01-22 DIAGNOSIS — I2510 Atherosclerotic heart disease of native coronary artery without angina pectoris: Secondary | ICD-10-CM

## 2020-01-22 DIAGNOSIS — I480 Paroxysmal atrial fibrillation: Secondary | ICD-10-CM

## 2020-01-22 DIAGNOSIS — I10 Essential (primary) hypertension: Secondary | ICD-10-CM

## 2020-01-22 DIAGNOSIS — Z7901 Long term (current) use of anticoagulants: Secondary | ICD-10-CM

## 2020-01-22 DIAGNOSIS — E118 Type 2 diabetes mellitus with unspecified complications: Secondary | ICD-10-CM

## 2020-01-22 DIAGNOSIS — R079 Chest pain, unspecified: Secondary | ICD-10-CM

## 2020-01-22 DIAGNOSIS — R0602 Shortness of breath: Secondary | ICD-10-CM

## 2020-01-22 DIAGNOSIS — E7800 Pure hypercholesterolemia, unspecified: Secondary | ICD-10-CM

## 2020-01-22 MED ORDER — DOFETILIDE 125 MCG PO CAP
ORAL_CAPSULE | Freq: Two times a day (BID) | 3 refills
Start: 2020-01-22 — End: ?

## 2020-01-22 MED ORDER — METOPROLOL TARTRATE 50 MG PO TAB
ORAL_TABLET | Freq: Two times a day (BID) | 3 refills
Start: 2020-01-22 — End: ?

## 2020-01-22 MED ORDER — LOSARTAN 100 MG PO TAB
ORAL_TABLET | Freq: Every day | 3 refills
Start: 2020-01-22 — End: ?

## 2020-01-23 ENCOUNTER — Encounter: Admit: 2020-01-23 | Discharge: 2020-01-23 | Payer: MEDICARE

## 2020-01-23 DIAGNOSIS — I4891 Unspecified atrial fibrillation: Secondary | ICD-10-CM

## 2020-01-23 LAB — PROTIME INR (PT): Lab: 2.6

## 2020-01-29 ENCOUNTER — Encounter: Admit: 2020-01-29 | Discharge: 2020-01-29 | Payer: MEDICARE

## 2020-01-29 DIAGNOSIS — I4891 Unspecified atrial fibrillation: Secondary | ICD-10-CM

## 2020-01-29 LAB — PROTIME INR (PT): Lab: 3.2 K/UL — ABNORMAL HIGH (ref 2–3)

## 2020-02-05 ENCOUNTER — Encounter: Admit: 2020-02-05 | Discharge: 2020-02-05 | Payer: MEDICARE

## 2020-02-05 LAB — PROTIME INR (PT): Lab: 2.3

## 2020-02-12 ENCOUNTER — Encounter: Admit: 2020-02-12 | Discharge: 2020-02-12 | Payer: MEDICARE

## 2020-02-12 DIAGNOSIS — I4891 Unspecified atrial fibrillation: Secondary | ICD-10-CM

## 2020-02-12 LAB — PROTIME INR (PT): Lab: 2.4 mg/dL (ref 8.5–10.6)

## 2020-02-19 ENCOUNTER — Encounter: Admit: 2020-02-19 | Discharge: 2020-02-19 | Payer: MEDICARE

## 2020-02-19 DIAGNOSIS — I4891 Unspecified atrial fibrillation: Secondary | ICD-10-CM

## 2020-02-19 LAB — PROTIME INR (PT): Lab: 2.9

## 2020-03-04 ENCOUNTER — Encounter: Admit: 2020-03-04 | Discharge: 2020-03-04 | Payer: MEDICARE

## 2020-03-04 DIAGNOSIS — I4891 Unspecified atrial fibrillation: Secondary | ICD-10-CM

## 2020-03-04 LAB — PROTIME INR (PT): Lab: 2.9 M/UL (ref 4.0–5.0)

## 2020-03-11 ENCOUNTER — Encounter: Admit: 2020-03-11 | Discharge: 2020-03-11 | Payer: MEDICARE

## 2020-03-11 DIAGNOSIS — I4811 Longstanding persistent atrial fibrillation: Secondary | ICD-10-CM

## 2020-03-11 DIAGNOSIS — I4891 Unspecified atrial fibrillation: Secondary | ICD-10-CM

## 2020-03-11 LAB — PROTIME INR (PT): Lab: 2.4 mg/dL (ref 70–100)

## 2020-03-18 ENCOUNTER — Encounter: Admit: 2020-03-18 | Discharge: 2020-03-18 | Payer: MEDICARE

## 2020-03-18 DIAGNOSIS — I4891 Unspecified atrial fibrillation: Secondary | ICD-10-CM

## 2020-03-18 LAB — PROTIME INR (PT): Lab: 2.7

## 2020-03-27 ENCOUNTER — Encounter: Admit: 2020-03-27 | Discharge: 2020-03-27 | Payer: MEDICARE

## 2020-03-27 DIAGNOSIS — I4891 Unspecified atrial fibrillation: Secondary | ICD-10-CM

## 2020-03-27 LAB — PROTIME INR (PT): Lab: 2.9

## 2020-04-01 ENCOUNTER — Encounter: Admit: 2020-04-01 | Discharge: 2020-04-01 | Payer: MEDICARE

## 2020-04-08 ENCOUNTER — Encounter: Admit: 2020-04-08 | Discharge: 2020-04-08 | Payer: MEDICARE

## 2020-04-08 DIAGNOSIS — I4891 Unspecified atrial fibrillation: Secondary | ICD-10-CM

## 2020-04-08 LAB — PROTIME INR (PT): Lab: 2.3

## 2020-04-15 ENCOUNTER — Encounter: Admit: 2020-04-15 | Discharge: 2020-04-15 | Payer: MEDICARE

## 2020-04-15 DIAGNOSIS — I4891 Unspecified atrial fibrillation: Secondary | ICD-10-CM

## 2020-04-15 LAB — PROTIME INR (PT): Lab: 2.6 K/UL (ref 0–0.80)

## 2020-04-23 ENCOUNTER — Encounter: Admit: 2020-04-23 | Discharge: 2020-04-23 | Payer: MEDICARE

## 2020-04-23 DIAGNOSIS — I4891 Unspecified atrial fibrillation: Secondary | ICD-10-CM

## 2020-04-23 LAB — PROTIME INR (PT): Lab: 2.9

## 2020-04-30 ENCOUNTER — Encounter: Admit: 2020-04-30 | Discharge: 2020-04-30 | Payer: MEDICARE

## 2020-04-30 DIAGNOSIS — I4891 Unspecified atrial fibrillation: Secondary | ICD-10-CM

## 2020-04-30 LAB — PROTIME INR (PT): Lab: 2.9

## 2020-05-06 ENCOUNTER — Encounter: Admit: 2020-05-06 | Discharge: 2020-05-06 | Payer: MEDICARE

## 2020-05-06 DIAGNOSIS — I4891 Unspecified atrial fibrillation: Secondary | ICD-10-CM

## 2020-05-06 LAB — PROTIME INR (PT): Lab: 2.9

## 2020-05-13 ENCOUNTER — Encounter: Admit: 2020-05-13 | Discharge: 2020-05-13 | Payer: MEDICARE

## 2020-05-13 DIAGNOSIS — I4891 Unspecified atrial fibrillation: Secondary | ICD-10-CM

## 2020-05-13 LAB — PROTIME INR (PT): Lab: 2.7

## 2020-05-20 ENCOUNTER — Encounter: Admit: 2020-05-20 | Discharge: 2020-05-20 | Payer: MEDICARE

## 2020-05-20 DIAGNOSIS — I4891 Unspecified atrial fibrillation: Secondary | ICD-10-CM

## 2020-05-20 LAB — PROTIME INR (PT): Lab: 2.5

## 2020-06-05 ENCOUNTER — Encounter: Admit: 2020-06-05 | Discharge: 2020-06-05 | Payer: MEDICARE

## 2020-06-05 DIAGNOSIS — I4891 Unspecified atrial fibrillation: Secondary | ICD-10-CM

## 2020-06-05 DIAGNOSIS — I4811 Longstanding persistent atrial fibrillation: Secondary | ICD-10-CM

## 2020-06-05 LAB — PROTIME INR (PT): Lab: 2.5 — AB

## 2020-06-10 ENCOUNTER — Encounter: Admit: 2020-06-10 | Discharge: 2020-06-10 | Payer: MEDICARE

## 2020-06-10 DIAGNOSIS — I4891 Unspecified atrial fibrillation: Secondary | ICD-10-CM

## 2020-06-18 ENCOUNTER — Encounter: Admit: 2020-06-18 | Discharge: 2020-06-18 | Payer: MEDICARE

## 2020-06-18 DIAGNOSIS — I4891 Unspecified atrial fibrillation: Secondary | ICD-10-CM

## 2020-06-18 DIAGNOSIS — I4811 Longstanding persistent atrial fibrillation: Secondary | ICD-10-CM

## 2020-06-18 LAB — PROTIME INR (PT): Lab: 2.2

## 2020-06-25 ENCOUNTER — Encounter: Admit: 2020-06-25 | Discharge: 2020-06-25 | Payer: MEDICARE

## 2020-06-25 DIAGNOSIS — I4891 Unspecified atrial fibrillation: Secondary | ICD-10-CM

## 2020-06-25 LAB — PROTIME INR (PT): Lab: 2.1

## 2020-07-01 ENCOUNTER — Encounter

## 2020-07-01 DIAGNOSIS — I4891 Unspecified atrial fibrillation: Secondary | ICD-10-CM

## 2020-07-01 LAB — PROTIME INR (PT): Lab: 2.8

## 2020-07-09 ENCOUNTER — Encounter: Admit: 2020-07-09 | Discharge: 2020-07-09 | Payer: MEDICARE

## 2020-07-09 DIAGNOSIS — I4891 Unspecified atrial fibrillation: Secondary | ICD-10-CM

## 2020-07-09 LAB — PROTIME INR (PT): Lab: 2.1

## 2020-07-15 ENCOUNTER — Encounter: Admit: 2020-07-15 | Discharge: 2020-07-15 | Payer: MEDICARE

## 2020-07-15 DIAGNOSIS — I4811 Longstanding persistent atrial fibrillation: Secondary | ICD-10-CM

## 2020-07-15 DIAGNOSIS — I4891 Unspecified atrial fibrillation: Secondary | ICD-10-CM

## 2020-07-15 LAB — PROTIME INR (PT): Lab: 2.3 mg/dL (ref 0.4–1.24)

## 2020-07-24 ENCOUNTER — Encounter: Admit: 2020-07-24 | Discharge: 2020-07-24 | Payer: MEDICARE

## 2020-07-24 DIAGNOSIS — I4891 Unspecified atrial fibrillation: Secondary | ICD-10-CM

## 2020-07-29 ENCOUNTER — Encounter: Admit: 2020-07-29 | Discharge: 2020-07-29 | Payer: MEDICARE

## 2020-07-29 DIAGNOSIS — I4891 Unspecified atrial fibrillation: Secondary | ICD-10-CM

## 2020-07-29 LAB — PROTIME INR (PT): Lab: 3.1

## 2020-08-06 ENCOUNTER — Encounter: Admit: 2020-08-06 | Discharge: 2020-08-06 | Payer: MEDICARE

## 2020-08-06 DIAGNOSIS — I4891 Unspecified atrial fibrillation: Secondary | ICD-10-CM

## 2020-08-12 ENCOUNTER — Encounter: Admit: 2020-08-12 | Discharge: 2020-08-12 | Payer: MEDICARE

## 2020-08-12 DIAGNOSIS — I4891 Unspecified atrial fibrillation: Secondary | ICD-10-CM

## 2020-08-12 LAB — PROTIME INR (PT): Lab: 2.2

## 2020-08-19 ENCOUNTER — Encounter: Admit: 2020-08-19 | Discharge: 2020-08-19 | Payer: MEDICARE

## 2020-08-19 DIAGNOSIS — I4891 Unspecified atrial fibrillation: Secondary | ICD-10-CM

## 2020-08-19 LAB — PROTIME INR (PT): Lab: 2.6

## 2020-08-28 ENCOUNTER — Encounter: Admit: 2020-08-28 | Discharge: 2020-08-28 | Payer: MEDICARE

## 2020-08-28 DIAGNOSIS — I4891 Unspecified atrial fibrillation: Secondary | ICD-10-CM

## 2020-08-29 ENCOUNTER — Encounter: Admit: 2020-08-29 | Discharge: 2020-08-29 | Payer: MEDICARE

## 2020-08-29 DIAGNOSIS — I4891 Unspecified atrial fibrillation: Secondary | ICD-10-CM

## 2020-08-30 ENCOUNTER — Encounter: Admit: 2020-08-30 | Discharge: 2020-08-30 | Payer: MEDICARE

## 2020-08-30 NOTE — Telephone Encounter
-----   Message from Sheilah Pigeon sent at 08/30/2020  9:04 AM CDT -----  Regarding: RE: due for annual OV in May  Called pt.  No answer. Mailbox not set up.   ----- Message -----  From: Ida Rogue, RN  Sent: 08/30/2020   8:52 AM CDT  To: [facility]  Subject: due for annual OV in May                         Please call and get scheduled    Thank you!

## 2020-09-03 ENCOUNTER — Encounter: Admit: 2020-09-03 | Discharge: 2020-09-03 | Payer: MEDICARE

## 2020-09-03 DIAGNOSIS — I4891 Unspecified atrial fibrillation: Secondary | ICD-10-CM

## 2020-09-03 LAB — PROTIME INR (PT): Lab: 2

## 2020-09-09 ENCOUNTER — Encounter: Admit: 2020-09-09 | Discharge: 2020-09-09 | Payer: MEDICARE

## 2020-09-09 DIAGNOSIS — I4891 Unspecified atrial fibrillation: Secondary | ICD-10-CM

## 2020-09-09 LAB — PROTIME INR (PT): INR: 2 g/dL (ref 3.5–5.0)

## 2020-09-16 ENCOUNTER — Encounter: Admit: 2020-09-16 | Discharge: 2020-09-16 | Payer: MEDICARE

## 2020-09-16 DIAGNOSIS — I4891 Unspecified atrial fibrillation: Secondary | ICD-10-CM

## 2020-09-16 LAB — PROTIME INR (PT): INR: 2.2

## 2020-09-24 ENCOUNTER — Encounter: Admit: 2020-09-24 | Discharge: 2020-09-24 | Payer: MEDICARE

## 2020-09-24 DIAGNOSIS — I4891 Unspecified atrial fibrillation: Secondary | ICD-10-CM

## 2020-10-01 ENCOUNTER — Encounter: Admit: 2020-10-01 | Discharge: 2020-10-01 | Payer: MEDICARE

## 2020-10-01 DIAGNOSIS — I4891 Unspecified atrial fibrillation: Secondary | ICD-10-CM

## 2020-10-08 ENCOUNTER — Encounter: Admit: 2020-10-08 | Discharge: 2020-10-08 | Payer: MEDICARE

## 2020-10-08 DIAGNOSIS — I4891 Unspecified atrial fibrillation: Secondary | ICD-10-CM

## 2020-10-08 LAB — PROTIME INR (PT): INR: 2

## 2020-10-22 ENCOUNTER — Encounter: Admit: 2020-10-22 | Discharge: 2020-10-22 | Payer: MEDICARE

## 2020-10-22 DIAGNOSIS — I4891 Unspecified atrial fibrillation: Secondary | ICD-10-CM

## 2020-10-22 LAB — PROTIME INR (PT): INR: 2.4 g/dL (ref 32.0–36.0)

## 2020-10-28 ENCOUNTER — Encounter: Admit: 2020-10-28 | Discharge: 2020-10-28 | Payer: MEDICARE

## 2020-10-28 DIAGNOSIS — I4891 Unspecified atrial fibrillation: Secondary | ICD-10-CM

## 2020-10-28 LAB — PROTIME INR (PT): INR: 2.5

## 2020-11-05 ENCOUNTER — Encounter: Admit: 2020-11-05 | Discharge: 2020-11-05 | Payer: MEDICARE

## 2020-11-05 DIAGNOSIS — I4891 Unspecified atrial fibrillation: Secondary | ICD-10-CM

## 2020-11-05 LAB — PROTIME INR (PT): INR: 2.5

## 2020-11-11 ENCOUNTER — Encounter: Admit: 2020-11-11 | Discharge: 2020-11-11 | Payer: MEDICARE

## 2020-11-11 DIAGNOSIS — I4891 Unspecified atrial fibrillation: Secondary | ICD-10-CM

## 2020-11-11 LAB — PROTIME INR (PT): INR: 2

## 2020-11-17 ENCOUNTER — Encounter: Admit: 2020-11-17 | Discharge: 2020-11-17 | Payer: MEDICARE

## 2020-11-17 DIAGNOSIS — I2510 Atherosclerotic heart disease of native coronary artery without angina pectoris: Secondary | ICD-10-CM

## 2020-11-17 MED ORDER — SIMVASTATIN 80 MG PO TAB
ORAL_TABLET | Freq: Every evening | 3 refills
Start: 2020-11-17 — End: ?

## 2020-11-25 ENCOUNTER — Encounter: Admit: 2020-11-25 | Discharge: 2020-11-25 | Payer: MEDICARE

## 2020-11-25 DIAGNOSIS — I4891 Unspecified atrial fibrillation: Secondary | ICD-10-CM

## 2020-11-25 LAB — PROTIME INR (PT): INR: 2

## 2020-12-02 ENCOUNTER — Encounter: Admit: 2020-12-02 | Discharge: 2020-12-02 | Payer: MEDICARE

## 2020-12-02 DIAGNOSIS — I4891 Unspecified atrial fibrillation: Secondary | ICD-10-CM

## 2020-12-02 LAB — PROTIME INR (PT): INR: 2.1

## 2020-12-10 ENCOUNTER — Encounter: Admit: 2020-12-10 | Discharge: 2020-12-10 | Payer: MEDICARE

## 2020-12-10 DIAGNOSIS — I4891 Unspecified atrial fibrillation: Secondary | ICD-10-CM

## 2020-12-10 LAB — PROTIME INR (PT): INR: 2.4

## 2020-12-24 ENCOUNTER — Encounter: Admit: 2020-12-24 | Discharge: 2020-12-24 | Payer: MEDICARE

## 2020-12-24 DIAGNOSIS — I4891 Unspecified atrial fibrillation: Secondary | ICD-10-CM

## 2020-12-30 ENCOUNTER — Encounter: Admit: 2020-12-30 | Discharge: 2020-12-30 | Payer: MEDICARE

## 2020-12-30 DIAGNOSIS — I4891 Unspecified atrial fibrillation: Secondary | ICD-10-CM

## 2020-12-30 LAB — PROTIME INR (PT): INR: 2.2

## 2021-01-06 ENCOUNTER — Encounter: Admit: 2021-01-06 | Discharge: 2021-01-06 | Payer: MEDICARE

## 2021-01-06 DIAGNOSIS — Z7901 Long term (current) use of anticoagulants: Secondary | ICD-10-CM

## 2021-01-06 DIAGNOSIS — I4891 Unspecified atrial fibrillation: Secondary | ICD-10-CM

## 2021-01-06 LAB — PROTIME INR (PT): INR: 2.5

## 2021-01-14 ENCOUNTER — Encounter: Admit: 2021-01-14 | Discharge: 2021-01-14 | Payer: MEDICARE

## 2021-01-14 DIAGNOSIS — Z7901 Long term (current) use of anticoagulants: Secondary | ICD-10-CM

## 2021-01-14 DIAGNOSIS — I4891 Unspecified atrial fibrillation: Secondary | ICD-10-CM

## 2021-01-14 LAB — PROTIME INR (PT): INR: 2

## 2021-01-16 ENCOUNTER — Encounter: Admit: 2021-01-16 | Discharge: 2021-01-16 | Payer: MEDICARE

## 2021-01-16 DIAGNOSIS — R0602 Shortness of breath: Secondary | ICD-10-CM

## 2021-01-16 DIAGNOSIS — E7800 Pure hypercholesterolemia, unspecified: Secondary | ICD-10-CM

## 2021-01-16 DIAGNOSIS — I10 Essential (primary) hypertension: Secondary | ICD-10-CM

## 2021-01-16 DIAGNOSIS — Z87891 Personal history of nicotine dependence: Secondary | ICD-10-CM

## 2021-01-16 DIAGNOSIS — I2510 Atherosclerotic heart disease of native coronary artery without angina pectoris: Secondary | ICD-10-CM

## 2021-01-16 DIAGNOSIS — N189 Chronic kidney disease, unspecified: Secondary | ICD-10-CM

## 2021-01-16 DIAGNOSIS — R079 Chest pain, unspecified: Secondary | ICD-10-CM

## 2021-01-16 DIAGNOSIS — R9439 Abnormal result of other cardiovascular function study: Secondary | ICD-10-CM

## 2021-01-16 DIAGNOSIS — E118 Type 2 diabetes mellitus with unspecified complications: Secondary | ICD-10-CM

## 2021-01-16 DIAGNOSIS — Z7901 Long term (current) use of anticoagulants: Secondary | ICD-10-CM

## 2021-01-16 DIAGNOSIS — I480 Paroxysmal atrial fibrillation: Secondary | ICD-10-CM

## 2021-01-16 MED ORDER — METOPROLOL TARTRATE 50 MG PO TAB
ORAL_TABLET | Freq: Two times a day (BID) | 3 refills
Start: 2021-01-16 — End: ?

## 2021-01-16 MED ORDER — LOSARTAN 100 MG PO TAB
ORAL_TABLET | Freq: Every day | 3 refills
Start: 2021-01-16 — End: ?

## 2021-01-16 MED ORDER — FUROSEMIDE 40 MG PO TAB
ORAL_TABLET | ORAL | 3 refills | 90.00000 days | Status: AC
Start: 2021-01-16 — End: ?

## 2021-01-16 MED ORDER — DOFETILIDE 125 MCG PO CAP
ORAL_CAPSULE | Freq: Two times a day (BID) | 3 refills
Start: 2021-01-16 — End: ?

## 2021-01-16 MED ORDER — WARFARIN 4 MG PO TAB
ORAL_TABLET | Freq: Every day | ORAL | 3 refills | 90.00000 days | Status: AC
Start: 2021-01-16 — End: ?

## 2021-01-29 ENCOUNTER — Encounter: Admit: 2021-01-29 | Discharge: 2021-01-29 | Payer: MEDICARE

## 2021-01-29 DIAGNOSIS — Z7901 Long term (current) use of anticoagulants: Secondary | ICD-10-CM

## 2021-01-29 DIAGNOSIS — I4891 Unspecified atrial fibrillation: Secondary | ICD-10-CM

## 2021-01-29 LAB — PROTIME INR (PT): INR: 2.5

## 2021-02-03 ENCOUNTER — Encounter: Admit: 2021-02-03 | Discharge: 2021-02-03 | Payer: MEDICARE

## 2021-02-03 DIAGNOSIS — Z7901 Long term (current) use of anticoagulants: Secondary | ICD-10-CM

## 2021-02-03 DIAGNOSIS — I4891 Unspecified atrial fibrillation: Secondary | ICD-10-CM

## 2021-02-03 LAB — PROTIME INR (PT): INR: 2.7

## 2021-02-10 ENCOUNTER — Encounter: Admit: 2021-02-10 | Discharge: 2021-02-10 | Payer: MEDICARE

## 2021-02-10 DIAGNOSIS — Z7901 Long term (current) use of anticoagulants: Secondary | ICD-10-CM

## 2021-02-10 DIAGNOSIS — I4891 Unspecified atrial fibrillation: Secondary | ICD-10-CM

## 2021-02-10 LAB — PROTIME INR (PT): INR: 2

## 2021-02-24 ENCOUNTER — Encounter: Admit: 2021-02-24 | Discharge: 2021-02-24 | Payer: MEDICARE

## 2021-02-24 DIAGNOSIS — I4891 Unspecified atrial fibrillation: Secondary | ICD-10-CM

## 2021-02-24 DIAGNOSIS — Z7901 Long term (current) use of anticoagulants: Secondary | ICD-10-CM

## 2021-02-24 LAB — PROTIME INR (PT): INR: 2.5

## 2021-03-03 ENCOUNTER — Encounter: Admit: 2021-03-03 | Discharge: 2021-03-03 | Payer: MEDICARE

## 2021-03-03 DIAGNOSIS — I4891 Unspecified atrial fibrillation: Secondary | ICD-10-CM

## 2021-03-03 DIAGNOSIS — Z7901 Long term (current) use of anticoagulants: Secondary | ICD-10-CM

## 2021-03-03 LAB — PROTIME INR (PT): INR: 2.4

## 2021-03-18 ENCOUNTER — Encounter: Admit: 2021-03-18 | Discharge: 2021-03-18 | Payer: MEDICARE

## 2021-03-18 DIAGNOSIS — I4891 Unspecified atrial fibrillation: Secondary | ICD-10-CM

## 2021-03-18 DIAGNOSIS — Z7901 Long term (current) use of anticoagulants: Secondary | ICD-10-CM

## 2021-03-18 LAB — PROTIME INR (PT): INR: 2.9

## 2021-04-14 ENCOUNTER — Encounter: Admit: 2021-04-14 | Discharge: 2021-04-14 | Payer: MEDICARE

## 2021-04-14 DIAGNOSIS — I4891 Unspecified atrial fibrillation: Secondary | ICD-10-CM

## 2021-04-14 DIAGNOSIS — Z7901 Long term (current) use of anticoagulants: Secondary | ICD-10-CM

## 2021-04-14 LAB — PROTIME INR (PT): INR: 2.8

## 2021-04-22 ENCOUNTER — Encounter: Admit: 2021-04-22 | Discharge: 2021-04-22 | Payer: MEDICARE

## 2021-04-22 DIAGNOSIS — I4891 Unspecified atrial fibrillation: Secondary | ICD-10-CM

## 2021-04-22 DIAGNOSIS — Z7901 Long term (current) use of anticoagulants: Secondary | ICD-10-CM

## 2021-04-22 LAB — PROTIME INR (PT): INR: 2.3

## 2021-04-29 ENCOUNTER — Encounter: Admit: 2021-04-29 | Discharge: 2021-04-29 | Payer: MEDICARE

## 2021-04-29 DIAGNOSIS — I4891 Unspecified atrial fibrillation: Secondary | ICD-10-CM

## 2021-05-06 ENCOUNTER — Encounter: Admit: 2021-05-06 | Discharge: 2021-05-06 | Payer: MEDICARE

## 2021-05-06 DIAGNOSIS — Z7901 Long term (current) use of anticoagulants: Secondary | ICD-10-CM

## 2021-05-06 DIAGNOSIS — I4891 Unspecified atrial fibrillation: Secondary | ICD-10-CM

## 2021-05-06 LAB — PROTIME INR (PT): INR: 2

## 2021-05-20 ENCOUNTER — Encounter: Admit: 2021-05-20 | Discharge: 2021-05-20 | Payer: MEDICARE

## 2021-05-20 DIAGNOSIS — I4891 Unspecified atrial fibrillation: Secondary | ICD-10-CM

## 2021-05-20 DIAGNOSIS — Z7901 Long term (current) use of anticoagulants: Secondary | ICD-10-CM

## 2021-05-26 ENCOUNTER — Encounter: Admit: 2021-05-26 | Discharge: 2021-05-26 | Payer: MEDICARE

## 2021-05-26 DIAGNOSIS — I4891 Unspecified atrial fibrillation: Secondary | ICD-10-CM

## 2021-05-26 DIAGNOSIS — Z7901 Long term (current) use of anticoagulants: Secondary | ICD-10-CM

## 2021-05-26 LAB — PROTIME INR (PT): INR: 2

## 2021-06-24 ENCOUNTER — Encounter: Admit: 2021-06-24 | Discharge: 2021-06-24 | Payer: MEDICARE

## 2021-06-24 DIAGNOSIS — I4891 Unspecified atrial fibrillation: Secondary | ICD-10-CM

## 2021-06-24 DIAGNOSIS — Z7901 Long term (current) use of anticoagulants: Secondary | ICD-10-CM

## 2021-06-24 LAB — PROTIME INR (PT): INR: 2

## 2021-06-30 ENCOUNTER — Encounter: Admit: 2021-06-30 | Discharge: 2021-06-30 | Payer: MEDICARE

## 2021-06-30 DIAGNOSIS — I4891 Unspecified atrial fibrillation: Secondary | ICD-10-CM

## 2021-06-30 DIAGNOSIS — Z7901 Long term (current) use of anticoagulants: Secondary | ICD-10-CM

## 2021-06-30 LAB — PROTIME INR (PT): INR: 2

## 2021-07-08 ENCOUNTER — Encounter: Admit: 2021-07-08 | Discharge: 2021-07-08 | Payer: MEDICARE

## 2021-07-08 DIAGNOSIS — I4891 Unspecified atrial fibrillation: Secondary | ICD-10-CM

## 2021-07-08 DIAGNOSIS — Z7901 Long term (current) use of anticoagulants: Secondary | ICD-10-CM

## 2021-08-05 ENCOUNTER — Encounter: Admit: 2021-08-05 | Discharge: 2021-08-05 | Payer: MEDICARE

## 2021-08-05 DIAGNOSIS — I4891 Unspecified atrial fibrillation: Secondary | ICD-10-CM

## 2021-08-05 NOTE — Telephone Encounter
Attempted to reach patient about overdue INR.  All numbers listed in the chart are no longer active.  Called [facility], no record of INR since 2/21, no additional phone number.  Called Dr. Lorrin Mais office for an updated phone number.  Received the following number - [phone].  Attempted to call that number, the phone rings twice and disconnects.      Sent letter to pt's home address.

## 2021-08-27 ENCOUNTER — Encounter: Admit: 2021-08-27 | Discharge: 2021-08-27 | Payer: MEDICARE

## 2021-08-27 DIAGNOSIS — I2510 Atherosclerotic heart disease of native coronary artery without angina pectoris: Secondary | ICD-10-CM

## 2021-08-27 MED ORDER — SIMVASTATIN 80 MG PO TAB
ORAL_TABLET | 0 refills | Status: AC
Start: 2021-08-27 — End: ?

## 2021-09-30 ENCOUNTER — Encounter: Admit: 2021-09-30 | Discharge: 2021-09-30 | Payer: MEDICARE

## 2021-09-30 NOTE — Telephone Encounter
INR Overdue. Called and left message requesting pt to have INR drawn at earliest convenience.  Left callback number for any questions or concerns.

## 2021-10-27 ENCOUNTER — Encounter: Admit: 2021-10-27 | Discharge: 2021-10-27 | Payer: MEDICARE

## 2021-10-30 ENCOUNTER — Encounter: Admit: 2021-10-30 | Discharge: 2021-10-30 | Payer: MEDICARE

## 2021-10-30 DIAGNOSIS — R9439 Abnormal result of other cardiovascular function study: Secondary | ICD-10-CM

## 2021-10-30 DIAGNOSIS — G4730 Sleep apnea, unspecified: Secondary | ICD-10-CM

## 2021-10-30 DIAGNOSIS — R0989 Other specified symptoms and signs involving the circulatory and respiratory systems: Secondary | ICD-10-CM

## 2021-10-30 DIAGNOSIS — F17200 Nicotine dependence, unspecified, uncomplicated: Secondary | ICD-10-CM

## 2021-10-30 DIAGNOSIS — E782 Mixed hyperlipidemia: Secondary | ICD-10-CM

## 2021-10-30 DIAGNOSIS — I10 Essential (primary) hypertension: Secondary | ICD-10-CM

## 2021-10-30 DIAGNOSIS — I2510 Atherosclerotic heart disease of native coronary artery without angina pectoris: Secondary | ICD-10-CM

## 2021-10-30 DIAGNOSIS — E119 Type 2 diabetes mellitus without complications: Secondary | ICD-10-CM

## 2021-10-30 DIAGNOSIS — E785 Hyperlipidemia, unspecified: Secondary | ICD-10-CM

## 2021-10-30 DIAGNOSIS — I4891 Unspecified atrial fibrillation: Secondary | ICD-10-CM

## 2021-10-30 DIAGNOSIS — E669 Obesity, unspecified: Secondary | ICD-10-CM

## 2021-10-30 DIAGNOSIS — R079 Chest pain, unspecified: Secondary | ICD-10-CM

## 2021-10-30 DIAGNOSIS — I480 Paroxysmal atrial fibrillation: Secondary | ICD-10-CM

## 2021-10-30 MED ORDER — LOSARTAN 100 MG PO TAB
100 mg | ORAL_TABLET | Freq: Every day | ORAL | 3 refills | 90.00000 days | Status: AC
Start: 2021-10-30 — End: ?

## 2021-10-30 NOTE — Patient Instructions
Call inr's in to [phone].  You with get a voice recording.  Leave your name date of birth and the reading we will call you back.      Echo and Stress test at [place].    Scheduling in [phone]    [facility]    Nuclear Stress Test Instructions    Your cardiologist has asked that you have a nuclear stress test (also known as a Myocardial Perfusion Imaging (MPI) test.    This evaluation of your heart muscle consists of two sets of nuclear images and either a Treadmill stress test or a chemical stress test, decided by you and your physician.    You will get an IV placed in your arm for the test.    You will need to be able to raise your arm up by your head for about 20 minutes and lie on your back for about 10 minutes.  Please discuss this with your doctor or talk with the nuclear technologist or nurse if these are a problem for you.    It is recommended not to schedule any other appointment for the same day.      Wear comfortable clothing and walking shoes if you are walking on the treadmill.  Women should wear shorts or comfortable pants instead of dresses.   Sweatshirts or T-shirts work really well for imaging.    There may be enough time to leave to get a snack after the stress portion of the test.   The Technologist will give you a list of appropriate types of food you may eat and tell you what time to return for second set of images.    PLEASE NO CAFFEINE 24 HOURS PRIOR TO TEST:  Examples include coffee, tea, decaffeinated drinks, colas, [facility], Dr. Reino Kent.  Some orange sodas and root beers have caffeine, please check.  No energy drinks, Excedrin, Midol, or any foods CONTAINING CHOCOLATE.   Consuming Caffeine may postpone your test.    PLEASE DO NOT EAT OR DRINK THE MORNING OF YOUR TEST.  Water is ok to drink with your morning medications.    Please hold these medications the day of test:      DIABETIC PATIENTS:  if insulin dependent:  please take one third of your insulin with two pieces of dry toast and a small juice.  Bring remaining 2/3   insulin and oral diabetic medications with you to your test.    PLEASE NO TOBBACCO PRODUCTS BETWEEN SCANS  You will NOT need a driver for this test.  But are welcome to bring a visitor with you.   Visitors will not be able to accompany you back to stress room.   Please do not bring children to the nuclear stress test.    TEST FINDINGS:   You will receive the results of the test within 7 business days of completion of this test by telephone. If you have any questions concerning your nuclear stress test or if you do not hear from your cardiologist/or nurse with 7 business days, please call our office.

## 2021-10-30 NOTE — Assessment & Plan Note
Lab Results   Component Value Date    CHOL 98 12/23/2020    TRIG 122 12/23/2020    HDL 38 (L) 12/23/2020    LDL 36 12/23/2020    VLDL 24 12/23/2020    NONHDLCHOL 87 10/16/2009    CHOLHDLC 3 12/23/2020      This is the best part of him!

## 2021-10-30 NOTE — Assessment & Plan Note
Blood pressure goal should be an average of 130/80 or less.  He does not check his blood pressure at home.

## 2021-10-30 NOTE — Progress Notes
Date of Service: 10/30/2021    Douglas Fernandez is a 81 y.o. male.       HPI     Douglas Fernandez was in the [place] clinic today for follow-up regarding coronary disease.  He was accompanied by his son.  We have seen him very infrequently over the years and the last time he came into our clinic was a couple of years ago.    His coronary disease was not particularly symptomatic when he presented with an acute coronary syndrome and underwent LAD stenting back in 2009.  His last stress test was about 4 years ago and his ejection fraction was 62% without any perfusion abnormalities.  He remains free of any angina symptoms but he is not particularly active anymore.  His biggest complaint is breathlessness with minor exertion.  He has an oximeter and says that his oxygen saturation is always within normal limits.    Similarly he has had issues with atrial fibrillation in the past.  This was not associated with any symptoms either.  He underwent a TEE cardioversion several years ago and was initially treated with Multaq but had to be switched to dofetilide.  He has been maintained on warfarin and dofetilide for a number of years and there is been no evidence to suggest recurrent atrial fibrillation.    He is reasonably compliant with oral anticoagulation management.  He has a lot of bruises and has a big area of abrasion on his elbow after he stumbled in the bathroom a couple of days ago.  He denies any serious bleeding complications.    He has not had any TIA or stroke symptoms.  He does get problems with peripheral edema.         Vitals:    10/30/21 1522   BP: (!) 148/80   BP Source: Arm, Left Upper   Pulse: 84   SpO2: 97%   O2 Percent: 97 %   O2 Device: None (Room air)   PainSc: Zero   Weight: 117.8 kg (259 lb 12.8 oz)   Height: 175.3 cm (5' 9)     Body mass index is 38.37 kg/m?Marland Kitchen     Past Medical History  Patient Active Problem List    Diagnosis Date Noted   ? COVID-19 vaccine administered 09/28/2019   ? On dofetilide therapy 09/28/2019   ? Pre-operative cardiovascular examination 02/04/2017   ? On dofetilide therapy 08/20/2016   ? Warfarin anticoagulation 08/20/2016   ? Bilateral edema of lower extremity 05/08/2014   ? Sleep apnea 12/06/2012   ? Renal failure 08/25/2012   ? Atrial fibrillation (HCC) 06/30/2012     06/24/2012 - Symptoms of DOE, AF on EKG.  07/28/2012 - TEE and DCCV:  Successful DC cardioversion of Atrial fibrillation to sinus rhythm.  08/25/2012 - Pt in AF, Multaq stopped.  11/21/2012 - Hospital admission for Tikosyn initiation, DCCV to restore NSR.     ? History of tobacco abuse 06/30/2012   ? SOB (shortness of breath) 12/19/2010   ? Chest pain 10/15/2009   ? Type II diabetes mellitus (HCC) 10/15/2009   ? CAD (coronary artery disease) 11/25/2006     11/2006:  DES to mid and distal LAD after abnormal stress testing, done for chest discomfort  10/09 :   DES to mid-LAD after presentation with ACS related to restenosis.  Noted to have 60% distal RCA stenosis at that time.  01/11/2011 - Stress Test:  This is a normal study.  There are no fixed or  reversible perfusion abnormalities identified.  The LV function is preserved with an EF calculated at 67 percent.  The ECG portion of this study is unremarkable and not suggestive of ischemia.  No high risk prognostic indicators are associated with this study.       ? Abnormal cardiovascular stress test    ? Obesity    ? Hypertension    ? Hyperlipidemia          Review of Systems   Constitutional: Positive for malaise/fatigue.   HENT: Positive for congestion and hearing loss.    Eyes: Negative.    Cardiovascular: Positive for dyspnea on exertion and leg swelling.   Respiratory: Positive for shortness of breath.    Endocrine: Negative.    Hematologic/Lymphatic: Bruises/bleeds easily.   Skin: Negative.    Musculoskeletal: Positive for falls and muscle weakness.   Gastrointestinal: Positive for constipation and diarrhea.   Genitourinary: Positive for frequency.   Neurological: Positive for numbness, paresthesias and weakness.   Psychiatric/Behavioral: Negative.    Allergic/Immunologic: Negative.        Physical Exam    Physical Exam   General Appearance: no distress   Skin: multiple areas of ecchymoses  Digits and Nails: no cyanosis or clubbing   Eyes: conjunctivae and lids normal, pupils are equal and round   Teeth/Gums/Palate: dentition unremarkable, no lesions   Lips & Oral Mucosa: no pallor or cyanosis   Neck Veins: normal JVP , neck veins are not distended   Thyroid: no nodules, masses, tenderness or enlargement   Chest Inspection: chest is normal in appearance   Respiratory Effort: breathing comfortably, no respiratory distress   Auscultation/Percussion: lungs clear to auscultation, no rales or rhonchi, no wheezing   PMI: PMI not enlarged or displaced   Cardiac Rhythm: regular rhythm and normal rate   Cardiac Auscultation: S1, S2 normal, no rub, no gallop   Murmurs: no murmur   Peripheral Circulation: normal peripheral circulation   Carotid Arteries: normal carotid upstroke bilaterally, no bruits   Radial Arteries: normal symmetric radial pulses   Abdominal Aorta: no abdominal aortic bruit   Pedal Pulses: normal symmetric pedal pulses   Lower Extremity Edema: no lower extremity edema   Abdominal Exam: soft, non-tender, no masses, bowel sounds normal   Liver & Spleen: no organomegaly   Gait & Station: wheelchair  Muscle Strength: normal muscle tone   Orientation: oriented to time, place and person   Affect & Mood: appropriate and sustained affect   Language and Memory: patient responsive and seems to comprehend information   Neurologic Exam: neurological assessment grossly intact   Other: moves all extremities      Cardiovascular Studies    EKG:  SR, rate 77.  QTc 424 msec.    Cardiovascular Health Factors  Vitals BP Readings from Last 3 Encounters:   10/30/21 (!) 148/80   09/28/19 128/72   11/15/18 (!) 150/78     Wt Readings from Last 3 Encounters:   10/30/21 117.8 kg (259 lb 12.8 oz) 09/28/19 124.7 kg (275 lb)   11/15/18 125.9 kg (277 lb 9.6 oz)     BMI Readings from Last 3 Encounters:   10/30/21 38.37 kg/m?   09/28/19 40.61 kg/m?   11/15/18 40.99 kg/m?      Smoking Social History     Tobacco Use   Smoking Status Former   ? Packs/day: 2.00   ? Years: 30.00   ? Pack years: 60.00   ? Types: Cigarettes   ? Quit date:  11/30/2006   ? Years since quitting: 14.9   Smokeless Tobacco Never      Lipid Profile Cholesterol   Date Value Ref Range Status   12/23/2020 98  Final     HDL   Date Value Ref Range Status   12/23/2020 38 (L) >=40 Final     LDL   Date Value Ref Range Status   12/23/2020 36  Final     Triglycerides   Date Value Ref Range Status   12/23/2020 122  Final      Blood Sugar Hemoglobin A1C   Date Value Ref Range Status   07/12/2019 6.0  Final     Glucose   Date Value Ref Range Status   12/23/2020 83  Final   07/12/2019 90  Final   02/24/2018 101  Final     Glucose, POC   Date Value Ref Range Status   11/23/2012 90 70 - 100 MG/DL Final   16/02/9603 540 (H) 70 - 100 MG/DL Final   98/03/9146 829 (H) 70 - 100 MG/DL Final          Problems Addressed Today  Encounter Diagnoses   Name Primary?   ? Cardiovascular symptoms Yes   ? Paroxysmal atrial fibrillation (HCC)    ? Primary hypertension    ? Chest pain, unspecified type    ? Class 3 severe obesity due to excess calories with serious comorbidity and body mass index (BMI) of 40.0 to 44.9 in adult [facility])    ? Coronary artery disease involving native heart without angina pectoris, unspecified vessel or lesion type    ? Mixed hyperlipidemia        Assessment and Plan       Atrial fibrillation (HCC)  No history of symptoms associated with atrial fibrillation.  The QTc on today's EKG is within normal limits.  He has been compliant with oral anticoagulation management.    CAD (coronary artery disease)  His exertional dyspnea may be an angina equivalent and I have recommended a regadenoson thallium stress test to rule out progression of his coronary disease.    Hyperlipidemia  Lab Results   Component Value Date    CHOL 98 12/23/2020    TRIG 122 12/23/2020    HDL 38 (L) 12/23/2020    LDL 36 12/23/2020    VLDL 24 12/23/2020    NONHDLCHOL 87 10/16/2009    CHOLHDLC 3 12/23/2020      This is the best part of him!    Hypertension  Blood pressure goal should be an average of 130/80 or less.  He does not check his blood pressure at home.      Current Medications (including today's revisions)  ? albuterol (VENTOLIN HFA) 90 mcg/actuation inhaler Inhale two puffs by mouth into the lungs every 6 hours as needed for Wheezing or Shortness of Breath. Shake well before use.   ? budesonide/formoterol (SYMBICORT) 160/4.5 mcg HFAA inhalation Inhale two puffs by mouth into the lungs twice daily.     ? dofetilide (TIKOSYN) 125 mcg capsule TAKE 1 CAPSULE TWICE A DAY   ? fenofibrate micronized (LOFIBRA) 134 mg capsule TAKE 1 CAPSULE DAILY BEFORE BREAKFAST   ? furosemide (LASIX) 40 mg tablet TAKE 2 TABLETS IN THE MORNING AND 1 TABLET IN THE EVENING (Patient taking differently: Take one tablet by mouth daily. TAKE 2 TABLETS IN THE MORNING AND 1 TABLET IN THE EVENING)   ? insulin glargine (LANTUS) 100 unit/mL injection Inject fifty five Units under the  skin at bedtime daily. 1 vial contains 10mL   ? losartan (COZAAR) 100 mg tablet Take one tablet by mouth daily.   ? magnesium oxide (MAG-OX) 400 mg tablet TAKE 1 TABLET BY MOUTH TWICE A DAY   ? metFORMIN (GLUCOPHAGE) 500 mg tablet Take one tablet by mouth twice daily.   ? metoprolol tartrate (LOPRESSOR) 50 mg tablet TAKE 1 TABLET TWICE A DAY   ? nitroglycerin (NITROSTAT) 0.4 mg tablet Place one tablet under tongue every 5 minutes as needed.   ? omeprazole DR(+) (PRILOSEC) 20 mg PO capsule Take one capsule by mouth at bedtime daily.   ? potassium chloride SR (K-DUR) 10 mEq tablet Take one tablet by mouth twice daily. Take with a meal and a full glass of water.   ? SANTYL 250 unit/gram topical ointment    ? simvastatin (ZOCOR) 80 mg tablet TAKE 1 TABLET BY MOUTH DAILY AT  BEDTIME   ? SYNTHROID 75 mcg tablet Take one tablet by mouth daily.   ? tiotropium (SPIRIVA WITH HANDIHALER) 18 mcg capsule for inhaler Place one capsule into inhaler and inhale into lungs as directed daily.   ? warfarin (COUMADIN) 4 mg tablet TAKE 1 TO 2 TABLETS DAILY AS DIRECTED (BY CARDIOLOGY)     Total time spent on today's office visit was 60 minutes.  This includes face-to-face in person visit with patient as well as nonface-to-face time including review of the EMR, outside records, labs, radiologic studies, echocardiogram & other cardiovascular studies, formation of treatment plan, after visit summary, future disposition, and lastly on documentation.

## 2021-10-31 ENCOUNTER — Encounter: Admit: 2021-10-31 | Discharge: 2021-10-31 | Payer: MEDICARE

## 2021-10-31 NOTE — Progress Notes
Received call from [facility] with [facility] scheduling stating that the stres test cpt code 78676 is requiring priorauthorization.  Called [employer] ; pa not required. Reference number for call [id number].  Information given to [facility] with [facility] scheduling.

## 2021-11-10 ENCOUNTER — Encounter: Admit: 2021-11-10 | Discharge: 2021-11-10 | Payer: MEDICARE

## 2021-11-10 DIAGNOSIS — Z7901 Long term (current) use of anticoagulants: Secondary | ICD-10-CM

## 2021-11-10 DIAGNOSIS — R9439 Abnormal result of other cardiovascular function study: Secondary | ICD-10-CM

## 2021-11-10 DIAGNOSIS — I10 Essential (primary) hypertension: Secondary | ICD-10-CM

## 2021-11-10 DIAGNOSIS — Z87891 Personal history of nicotine dependence: Secondary | ICD-10-CM

## 2021-11-10 DIAGNOSIS — I480 Paroxysmal atrial fibrillation: Secondary | ICD-10-CM

## 2021-11-10 DIAGNOSIS — E118 Type 2 diabetes mellitus with unspecified complications: Secondary | ICD-10-CM

## 2021-11-10 DIAGNOSIS — N189 Chronic kidney disease, unspecified: Secondary | ICD-10-CM

## 2021-11-10 DIAGNOSIS — I2510 Atherosclerotic heart disease of native coronary artery without angina pectoris: Secondary | ICD-10-CM

## 2021-11-10 DIAGNOSIS — R079 Chest pain, unspecified: Secondary | ICD-10-CM

## 2021-11-10 DIAGNOSIS — R0602 Shortness of breath: Secondary | ICD-10-CM

## 2021-11-10 DIAGNOSIS — E7800 Pure hypercholesterolemia, unspecified: Secondary | ICD-10-CM

## 2021-11-10 MED ORDER — METOPROLOL TARTRATE 50 MG PO TAB
50 mg | ORAL_TABLET | Freq: Two times a day (BID) | ORAL | 1 refills | 90.00000 days | Status: AC
Start: 2021-11-10 — End: ?

## 2021-11-10 NOTE — Telephone Encounter
Patient's daughter Raynelle Fanning [phone]) called to report that Douglas Fernandez is having increasing fatigue.   Raynelle Fanning states that [facility] called her this morning to schedule pt's echo and stress test recently ordered by Dr. Barry Dienes, but she is afraid that she can't get him there because he doesn't know if he can walk to her car.  She states that his symptoms started last week, he had a respiratory virus, which has improved, but [name] continues to be very weak and tired.  She denies any other cardiac symptoms - no shortness of breath, chest pain, palpitations. He denies any dizziness, lightheadedness or syncope. [name] is able to get up and ambulate around his house.   Raynelle Fanning states that [name] is taking his medication, but has not been taking his metoprolol for an unknown amount of time.  She states that he ran out and has not gotten it refilled at this time.  He does not check his BP/HR at home. He is eating and drinking adequately without issue.    Will discuss with Dr. Rolm Bookbinder as Dr. Barry Dienes is out of the office.

## 2021-11-10 NOTE — Telephone Encounter
Discussed with Dr. Rolm Bookbinder in clinic by Waneta Martins. RN.  Resume metoprolol.  Try to get outpatient testing - if symptoms persist or worsen, go to the ED for evaluation and treatment.  Called and discussed recommendations with pt's daughter.  She is agreeable to plan. Will callback with any questions, concerns or problems.

## 2021-11-22 ENCOUNTER — Encounter: Admit: 2021-11-22 | Discharge: 2021-11-22 | Payer: MEDICARE

## 2021-11-22 DIAGNOSIS — I2510 Atherosclerotic heart disease of native coronary artery without angina pectoris: Secondary | ICD-10-CM

## 2021-11-22 MED ORDER — SIMVASTATIN 80 MG PO TAB
ORAL_TABLET | 3 refills
Start: 2021-11-22 — End: ?

## 2021-11-26 ENCOUNTER — Encounter: Admit: 2021-11-26 | Discharge: 2021-11-26 | Payer: MEDICARE

## 2021-12-01 ENCOUNTER — Encounter: Admit: 2021-12-01 | Discharge: 2021-12-01 | Payer: MEDICARE

## 2021-12-01 NOTE — Telephone Encounter
INR Overdue. Called and left message requesting pt to have INR drawn at earliest convenience.  Left callback number for any questions or concerns.

## 2021-12-12 ENCOUNTER — Encounter: Admit: 2021-12-12 | Discharge: 2021-12-12 | Payer: MEDICARE

## 2021-12-12 DIAGNOSIS — I480 Paroxysmal atrial fibrillation: Secondary | ICD-10-CM

## 2022-01-05 ENCOUNTER — Encounter: Admit: 2022-01-05 | Discharge: 2022-01-05 | Payer: MEDICARE

## 2022-01-05 MED ORDER — DOFETILIDE 125 MCG PO CAP
125 ug | ORAL_CAPSULE | Freq: Two times a day (BID) | ORAL | 3 refills | Status: AC
Start: 2022-01-05 — End: ?

## 2022-01-23 ENCOUNTER — Encounter: Admit: 2022-01-23 | Discharge: 2022-01-23 | Payer: MEDICARE

## 2022-01-23 DIAGNOSIS — I480 Paroxysmal atrial fibrillation: Secondary | ICD-10-CM

## 2022-01-23 MED ORDER — WARFARIN 4 MG PO TAB
ORAL_TABLET | 3 refills
Start: 2022-01-23 — End: ?

## 2022-02-10 ENCOUNTER — Encounter: Admit: 2022-02-10 | Discharge: 2022-02-10 | Payer: MEDICARE

## 2022-02-10 DIAGNOSIS — R079 Chest pain, unspecified: Secondary | ICD-10-CM

## 2022-02-10 DIAGNOSIS — R0602 Shortness of breath: Secondary | ICD-10-CM

## 2022-02-10 DIAGNOSIS — N189 Chronic kidney disease, unspecified: Secondary | ICD-10-CM

## 2022-02-10 DIAGNOSIS — Z87891 Personal history of nicotine dependence: Secondary | ICD-10-CM

## 2022-02-10 DIAGNOSIS — I480 Paroxysmal atrial fibrillation: Secondary | ICD-10-CM

## 2022-02-10 DIAGNOSIS — I10 Essential (primary) hypertension: Secondary | ICD-10-CM

## 2022-02-10 DIAGNOSIS — Z7901 Long term (current) use of anticoagulants: Secondary | ICD-10-CM

## 2022-02-10 DIAGNOSIS — I2510 Atherosclerotic heart disease of native coronary artery without angina pectoris: Secondary | ICD-10-CM

## 2022-02-10 DIAGNOSIS — R9439 Abnormal result of other cardiovascular function study: Secondary | ICD-10-CM

## 2022-02-10 DIAGNOSIS — E7800 Pure hypercholesterolemia, unspecified: Secondary | ICD-10-CM

## 2022-02-10 DIAGNOSIS — E118 Type 2 diabetes mellitus with unspecified complications: Secondary | ICD-10-CM

## 2022-02-10 MED ORDER — METOPROLOL TARTRATE 50 MG PO TAB
50 mg | ORAL_TABLET | Freq: Two times a day (BID) | ORAL | 3 refills | 90.00000 days | Status: AC
Start: 2022-02-10 — End: ?

## 2022-02-15 ENCOUNTER — Encounter: Admit: 2022-02-15 | Discharge: 2022-02-15 | Payer: MEDICARE

## 2022-02-15 DIAGNOSIS — I2510 Atherosclerotic heart disease of native coronary artery without angina pectoris: Secondary | ICD-10-CM

## 2022-02-15 MED ORDER — SIMVASTATIN 80 MG PO TAB
ORAL_TABLET | 3 refills
Start: 2022-02-15 — End: ?

## 2022-02-21 IMAGING — CR CHEST
2 series · 2 of 2 positions shown · non-contrast
Comparison: none

[shoulder external]
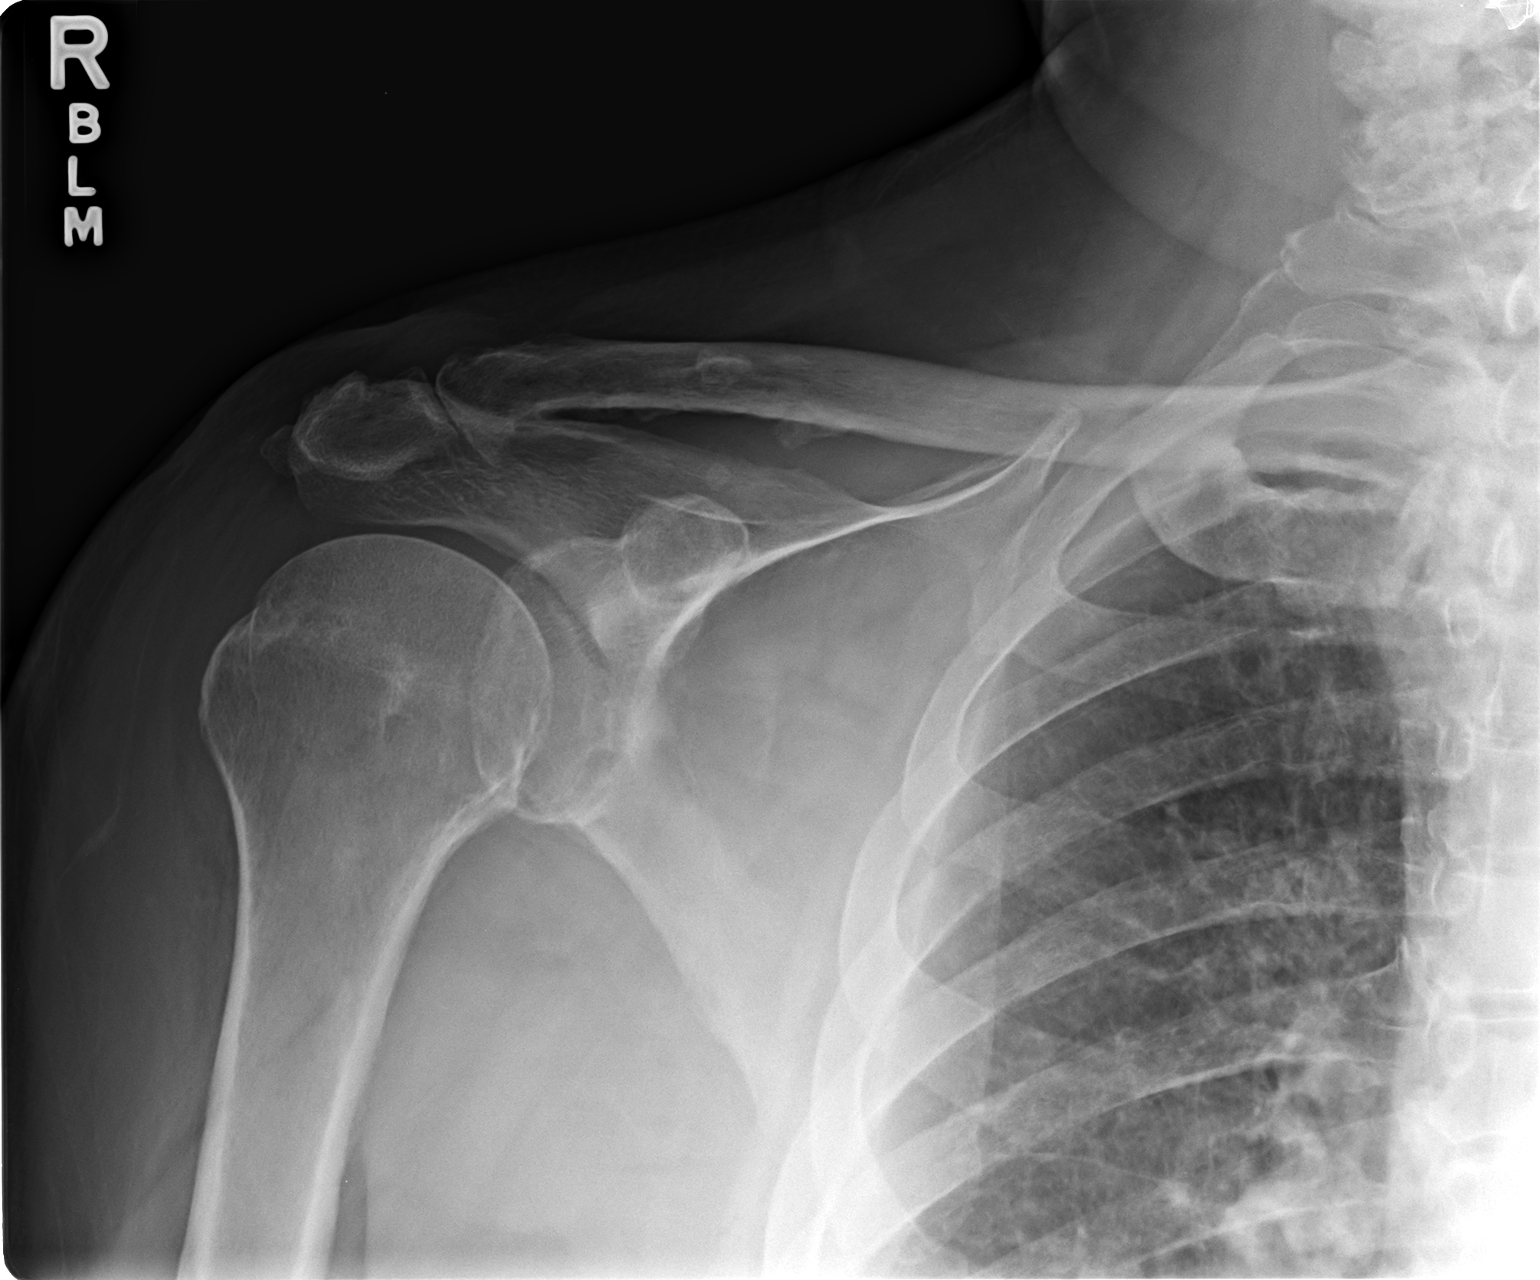

[shoulder internal]
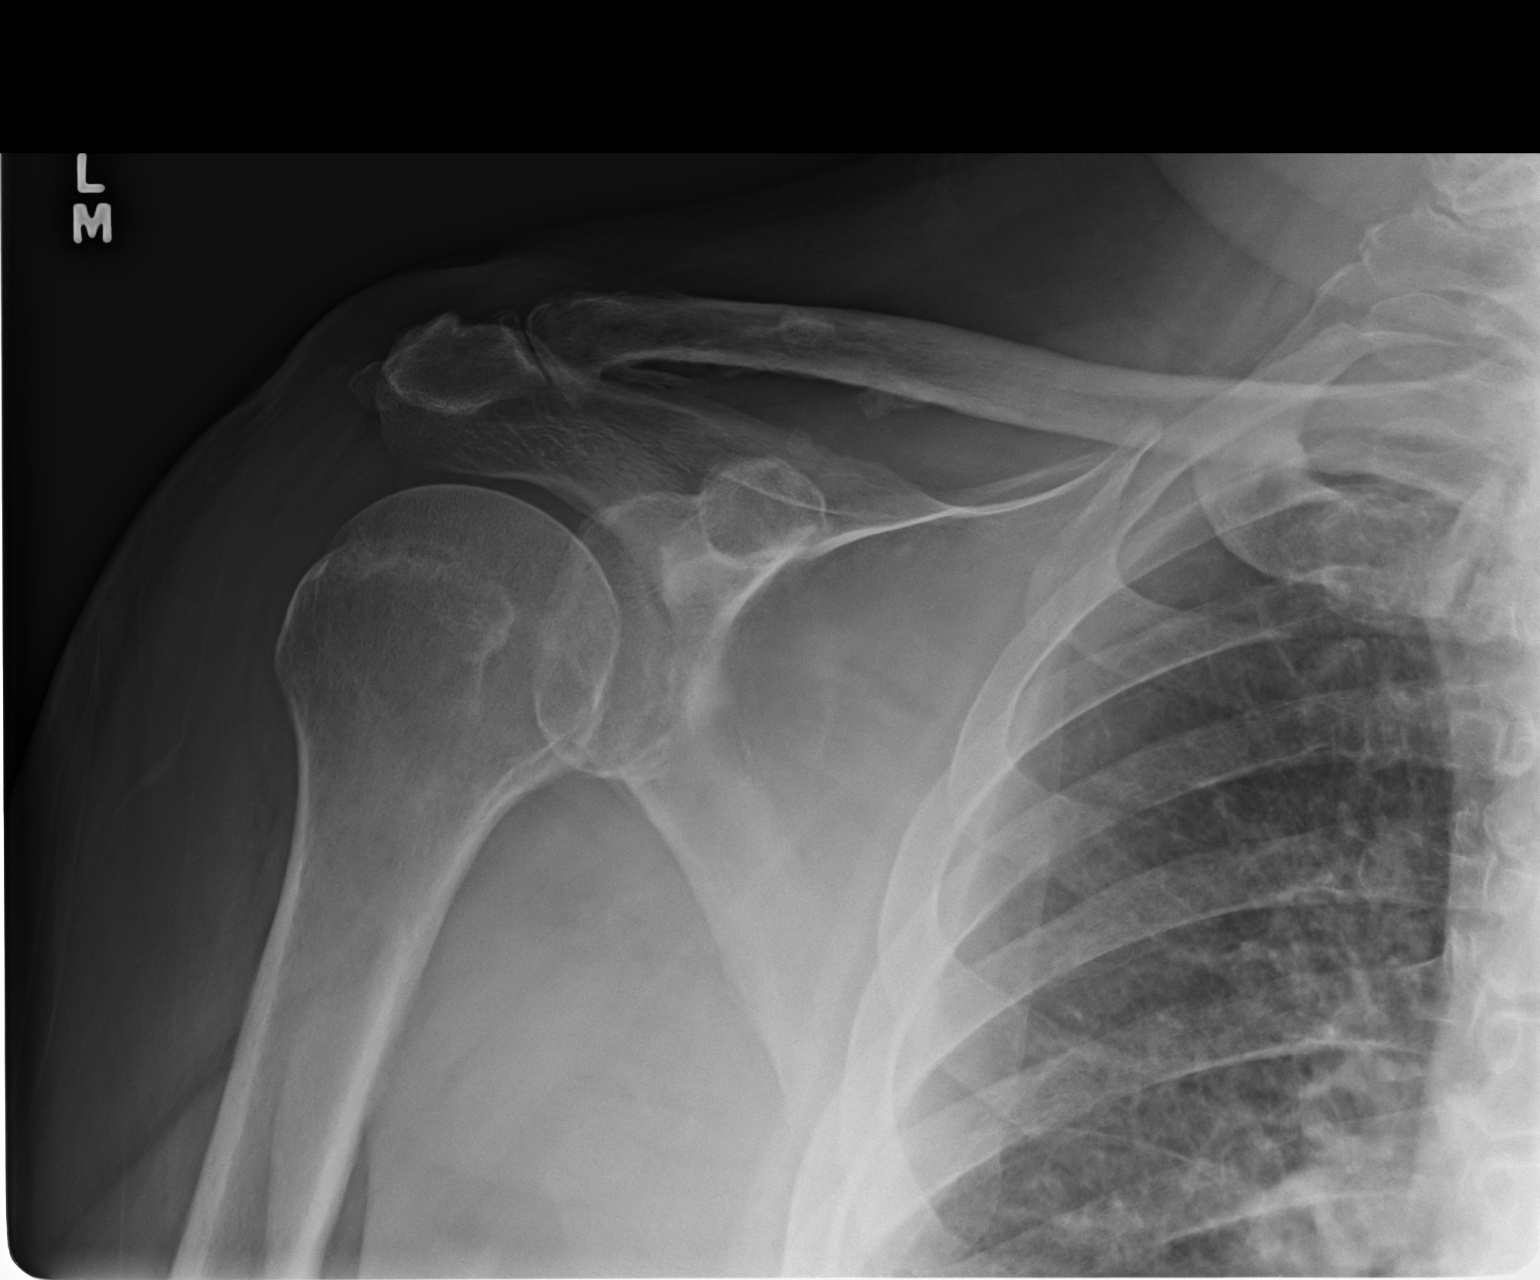

[2 of 2 positions shown; findings below may reference images not displayed]

EXAM

Right shoulder

INDICATION

shoulder pain
Patient c/o right shoulder pain. Patient states he fell last night, 10/08/19, and landed on that
shoulder. Very limited ROM. Patient states hx of shoulder sx. BM/TM

FINDINGS

Three views of the right shoulder were obtained.

There is no evidence of acute fracture or dislocation. There are prominent osteophytes along the
inferior margin of the distal clavicle and there are osteophytes of the acromion process. There are
small osteophytes of the glenoid process and humeral head.

Areas of soft tissue calcification seen along the inferior margin of the distal clavicle and
lateral margin of the acromion process per

IMPRESSION

There is osteoarthrosis of the right shoulder, most notably involving the AC joint. There are soft
tissue calcifications near the clavicle and acromion process. There is no acute fracture or
dislocation.

Tech Notes:

Patient c/o right shoulder pain. Patient states he fell last night, 10/08/19, and landed on that
shoulder. Very limited ROM. Patient states hx of shoulder sx. BM/TM

## 2022-03-03 ENCOUNTER — Encounter: Admit: 2022-03-03 | Discharge: 2022-03-03 | Payer: MEDICARE

## 2022-03-03 DIAGNOSIS — I480 Paroxysmal atrial fibrillation: Secondary | ICD-10-CM

## 2022-04-07 ENCOUNTER — Encounter: Admit: 2022-04-07 | Discharge: 2022-04-07 | Payer: MEDICARE

## 2022-04-07 NOTE — Telephone Encounter
Spoke to pt in f/u re: overdue INR and he states he is currently waiting on test strips to arrive. Advised we would wait till next week, but if they have not arrived by then, he might need to go to the lab to get INR checked. Pt verbalized understanding and was agreeable to this plan.    ----- Message -----  From: Rogelia Boga, RN  Sent: 04/07/2022  12:00 AM CST  To: [facility] Nurse [place]  Subject: INR Due 03/31/22                                 Douglas Fernandez is due for an INR test on 03/31/22.

## 2022-05-01 ENCOUNTER — Encounter: Admit: 2022-05-01 | Discharge: 2022-05-01 | Payer: MEDICARE

## 2022-05-01 NOTE — Telephone Encounter
Left a message for patient to have INR checked as soon as patient is able. Nursing line number left for questions or concerns.

## 2022-07-01 ENCOUNTER — Encounter: Admit: 2022-07-01 | Discharge: 2022-07-01 | Payer: MEDICARE

## 2022-07-01 DIAGNOSIS — Z7901 Long term (current) use of anticoagulants: Secondary | ICD-10-CM

## 2022-07-01 DIAGNOSIS — I4891 Unspecified atrial fibrillation: Secondary | ICD-10-CM

## 2022-07-01 LAB — PROTIME INR (PT): INR: 2.9 — ABNORMAL HIGH (ref 2–2)

## 2022-07-07 ENCOUNTER — Encounter: Admit: 2022-07-07 | Discharge: 2022-07-07 | Payer: MEDICARE

## 2022-07-07 DIAGNOSIS — I4891 Unspecified atrial fibrillation: Secondary | ICD-10-CM

## 2022-07-07 DIAGNOSIS — I480 Paroxysmal atrial fibrillation: Secondary | ICD-10-CM

## 2022-07-07 DIAGNOSIS — Z7901 Long term (current) use of anticoagulants: Secondary | ICD-10-CM

## 2022-07-07 LAB — PROTIME INR (PT): INR: 3.1 — ABNORMAL HIGH

## 2022-07-14 ENCOUNTER — Encounter: Admit: 2022-07-14 | Discharge: 2022-07-14 | Payer: MEDICARE

## 2022-07-14 DIAGNOSIS — Z7901 Long term (current) use of anticoagulants: Secondary | ICD-10-CM

## 2022-07-14 DIAGNOSIS — I480 Paroxysmal atrial fibrillation: Secondary | ICD-10-CM

## 2022-07-14 LAB — PROTIME INR (PT): INR: 2.4

## 2022-07-21 ENCOUNTER — Encounter: Admit: 2022-07-21 | Discharge: 2022-07-21 | Payer: MEDICARE

## 2022-07-21 DIAGNOSIS — I480 Paroxysmal atrial fibrillation: Secondary | ICD-10-CM

## 2022-07-21 DIAGNOSIS — Z7901 Long term (current) use of anticoagulants: Secondary | ICD-10-CM

## 2022-07-21 LAB — PROTIME INR (PT): INR: 2.5

## 2022-08-11 ENCOUNTER — Encounter: Admit: 2022-08-11 | Discharge: 2022-08-11 | Payer: MEDICARE

## 2022-08-11 DIAGNOSIS — Z7901 Long term (current) use of anticoagulants: Secondary | ICD-10-CM

## 2022-08-11 DIAGNOSIS — I480 Paroxysmal atrial fibrillation: Secondary | ICD-10-CM

## 2022-08-11 LAB — PROTIME INR (PT): INR: 1.6

## 2022-08-19 ENCOUNTER — Encounter: Admit: 2022-08-19 | Discharge: 2022-08-19 | Payer: MEDICARE

## 2022-08-19 DIAGNOSIS — I480 Paroxysmal atrial fibrillation: Secondary | ICD-10-CM

## 2022-08-19 DIAGNOSIS — Z7901 Long term (current) use of anticoagulants: Secondary | ICD-10-CM

## 2022-08-26 ENCOUNTER — Encounter: Admit: 2022-08-26 | Discharge: 2022-08-26 | Payer: MEDICARE

## 2022-08-26 DIAGNOSIS — Z7901 Long term (current) use of anticoagulants: Secondary | ICD-10-CM

## 2022-08-26 DIAGNOSIS — I480 Paroxysmal atrial fibrillation: Secondary | ICD-10-CM

## 2022-08-26 LAB — PROTIME INR (PT): INR: 2.7

## 2022-09-01 ENCOUNTER — Encounter: Admit: 2022-09-01 | Discharge: 2022-09-01 | Payer: MEDICARE

## 2022-09-01 DIAGNOSIS — Z7901 Long term (current) use of anticoagulants: Secondary | ICD-10-CM

## 2022-09-01 DIAGNOSIS — I480 Paroxysmal atrial fibrillation: Secondary | ICD-10-CM

## 2022-09-01 LAB — PROTIME INR (PT): INR: 2

## 2022-09-08 ENCOUNTER — Encounter: Admit: 2022-09-08 | Discharge: 2022-09-08 | Payer: MEDICARE

## 2022-09-08 DIAGNOSIS — I480 Paroxysmal atrial fibrillation: Secondary | ICD-10-CM

## 2022-09-08 DIAGNOSIS — Z7901 Long term (current) use of anticoagulants: Secondary | ICD-10-CM

## 2022-09-08 LAB — PROTIME INR (PT): INR: 2.1

## 2022-09-16 ENCOUNTER — Encounter: Admit: 2022-09-16 | Discharge: 2022-09-16 | Payer: MEDICARE

## 2022-09-16 DIAGNOSIS — I480 Paroxysmal atrial fibrillation: Secondary | ICD-10-CM

## 2022-09-16 DIAGNOSIS — Z7901 Long term (current) use of anticoagulants: Secondary | ICD-10-CM

## 2022-09-16 LAB — PROTIME INR (PT): INR: 2.3

## 2022-09-22 ENCOUNTER — Encounter: Admit: 2022-09-22 | Discharge: 2022-09-22 | Payer: MEDICARE

## 2022-09-22 MED ORDER — LOSARTAN 100 MG PO TAB
100 mg | ORAL_TABLET | Freq: Every day | ORAL | 3 refills
Start: 2022-09-22 — End: ?

## 2022-09-23 ENCOUNTER — Encounter: Admit: 2022-09-23 | Discharge: 2022-09-23 | Payer: MEDICARE

## 2022-09-23 DIAGNOSIS — I480 Paroxysmal atrial fibrillation: Secondary | ICD-10-CM

## 2022-09-23 DIAGNOSIS — Z7901 Long term (current) use of anticoagulants: Secondary | ICD-10-CM

## 2022-09-30 ENCOUNTER — Encounter: Admit: 2022-09-30 | Discharge: 2022-09-30 | Payer: MEDICARE

## 2022-09-30 DIAGNOSIS — I480 Paroxysmal atrial fibrillation: Secondary | ICD-10-CM

## 2022-09-30 DIAGNOSIS — Z7901 Long term (current) use of anticoagulants: Secondary | ICD-10-CM

## 2022-10-07 ENCOUNTER — Encounter: Admit: 2022-10-07 | Discharge: 2022-10-07 | Payer: MEDICARE

## 2022-10-07 DIAGNOSIS — I480 Paroxysmal atrial fibrillation: Secondary | ICD-10-CM

## 2022-10-07 DIAGNOSIS — Z7901 Long term (current) use of anticoagulants: Secondary | ICD-10-CM

## 2022-10-07 LAB — PROTIME INR (PT): INR: 2

## 2022-10-14 ENCOUNTER — Encounter: Admit: 2022-10-14 | Discharge: 2022-10-14 | Payer: MEDICARE

## 2022-10-14 DIAGNOSIS — Z7901 Long term (current) use of anticoagulants: Secondary | ICD-10-CM

## 2022-10-14 DIAGNOSIS — I480 Paroxysmal atrial fibrillation: Secondary | ICD-10-CM

## 2022-10-14 LAB — PROTIME INR (PT): INR: 2.2

## 2022-10-21 ENCOUNTER — Encounter: Admit: 2022-10-21 | Discharge: 2022-10-21 | Payer: MEDICARE

## 2022-10-21 DIAGNOSIS — I480 Paroxysmal atrial fibrillation: Secondary | ICD-10-CM

## 2022-10-21 DIAGNOSIS — Z7901 Long term (current) use of anticoagulants: Secondary | ICD-10-CM

## 2022-10-21 LAB — PROTIME INR (PT): INR: 2.3 pg (ref 26–34)

## 2022-10-28 ENCOUNTER — Encounter: Admit: 2022-10-28 | Discharge: 2022-10-28 | Payer: MEDICARE

## 2022-10-28 DIAGNOSIS — Z7901 Long term (current) use of anticoagulants: Secondary | ICD-10-CM

## 2022-10-28 DIAGNOSIS — I480 Paroxysmal atrial fibrillation: Secondary | ICD-10-CM

## 2022-11-04 ENCOUNTER — Encounter: Admit: 2022-11-04 | Discharge: 2022-11-04 | Payer: MEDICARE

## 2022-11-04 DIAGNOSIS — I480 Paroxysmal atrial fibrillation: Secondary | ICD-10-CM

## 2022-11-04 DIAGNOSIS — Z7901 Long term (current) use of anticoagulants: Secondary | ICD-10-CM

## 2022-11-04 LAB — PROTIME INR (PT): INR: 2.9 % — ABNORMAL HIGH (ref 11–15)

## 2022-11-10 ENCOUNTER — Encounter: Admit: 2022-11-10 | Discharge: 2022-11-10 | Payer: MEDICARE

## 2022-11-10 DIAGNOSIS — Z7901 Long term (current) use of anticoagulants: Secondary | ICD-10-CM

## 2022-11-10 DIAGNOSIS — I480 Paroxysmal atrial fibrillation: Secondary | ICD-10-CM

## 2022-11-10 LAB — PROTIME INR (PT): INR: 2.9

## 2022-11-24 ENCOUNTER — Encounter: Admit: 2022-11-24 | Discharge: 2022-11-24 | Payer: MEDICARE

## 2022-11-24 DIAGNOSIS — I480 Paroxysmal atrial fibrillation: Secondary | ICD-10-CM

## 2022-11-24 DIAGNOSIS — Z7901 Long term (current) use of anticoagulants: Secondary | ICD-10-CM

## 2022-11-24 LAB — PROTIME INR (PT): INR: 2.9 — ABNORMAL HIGH

## 2022-11-28 ENCOUNTER — Encounter: Admit: 2022-11-28 | Discharge: 2022-11-28 | Payer: MEDICARE

## 2022-11-28 MED ORDER — LOSARTAN 100 MG PO TAB
100 mg | ORAL_TABLET | Freq: Every day | ORAL | 3 refills
Start: 2022-11-28 — End: ?

## 2022-11-28 MED ORDER — DOFETILIDE 125 MCG PO CAP
125 ug | ORAL_CAPSULE | Freq: Two times a day (BID) | ORAL | 3 refills
Start: 2022-11-28 — End: ?

## 2022-12-02 ENCOUNTER — Encounter: Admit: 2022-12-02 | Discharge: 2022-12-02 | Payer: MEDICARE

## 2022-12-02 DIAGNOSIS — I480 Paroxysmal atrial fibrillation: Secondary | ICD-10-CM

## 2022-12-02 DIAGNOSIS — Z7901 Long term (current) use of anticoagulants: Secondary | ICD-10-CM

## 2022-12-02 LAB — PROTIME INR (PT): INR: 2.8 — ABNORMAL HIGH

## 2022-12-09 ENCOUNTER — Encounter: Admit: 2022-12-09 | Discharge: 2022-12-09 | Payer: MEDICARE

## 2022-12-09 DIAGNOSIS — I480 Paroxysmal atrial fibrillation: Secondary | ICD-10-CM

## 2022-12-09 DIAGNOSIS — Z7901 Long term (current) use of anticoagulants: Secondary | ICD-10-CM

## 2022-12-13 ENCOUNTER — Encounter: Admit: 2022-12-13 | Discharge: 2022-12-13 | Payer: MEDICARE

## 2022-12-13 DIAGNOSIS — I2510 Atherosclerotic heart disease of native coronary artery without angina pectoris: Secondary | ICD-10-CM

## 2022-12-13 MED ORDER — SIMVASTATIN 80 MG PO TAB
80 mg | ORAL_TABLET | Freq: Every evening | ORAL | 3 refills
Start: 2022-12-13 — End: ?

## 2022-12-17 ENCOUNTER — Encounter: Admit: 2022-12-17 | Discharge: 2022-12-17 | Payer: MEDICARE

## 2022-12-17 DIAGNOSIS — E785 Hyperlipidemia, unspecified: Secondary | ICD-10-CM

## 2022-12-17 DIAGNOSIS — I10 Essential (primary) hypertension: Secondary | ICD-10-CM

## 2022-12-17 DIAGNOSIS — E782 Mixed hyperlipidemia: Secondary | ICD-10-CM

## 2022-12-17 DIAGNOSIS — I2510 Atherosclerotic heart disease of native coronary artery without angina pectoris: Secondary | ICD-10-CM

## 2022-12-17 DIAGNOSIS — I480 Paroxysmal atrial fibrillation: Secondary | ICD-10-CM

## 2022-12-17 DIAGNOSIS — E119 Type 2 diabetes mellitus without complications: Secondary | ICD-10-CM

## 2022-12-17 DIAGNOSIS — I4891 Unspecified atrial fibrillation: Secondary | ICD-10-CM

## 2022-12-17 DIAGNOSIS — G4730 Sleep apnea, unspecified: Secondary | ICD-10-CM

## 2022-12-17 DIAGNOSIS — E669 Obesity, unspecified: Secondary | ICD-10-CM

## 2022-12-17 DIAGNOSIS — R9439 Abnormal result of other cardiovascular function study: Secondary | ICD-10-CM

## 2022-12-17 DIAGNOSIS — F17200 Nicotine dependence, unspecified, uncomplicated: Secondary | ICD-10-CM

## 2022-12-17 MED ORDER — ISOSORBIDE MONONITRATE 30 MG PO TB24
30 mg | ORAL_TABLET | Freq: Every morning | ORAL | 3 refills | 90.00000 days | Status: AC
Start: 2022-12-17 — End: ?

## 2022-12-17 MED ORDER — NITROGLYCERIN 0.4 MG SL SUBL
.4 mg | ORAL_TABLET | SUBLINGUAL | 3 refills | 9.00000 days | Status: AC | PRN
Start: 2022-12-17 — End: ?

## 2022-12-17 MED ORDER — AMIODARONE 200 MG PO TAB
ORAL_TABLET | ORAL | 4 refills | 42.00000 days | Status: AC
Start: 2022-12-17 — End: ?

## 2022-12-17 NOTE — Patient Instructions
Stop Tikosyn  Start amiodarone as directed  Imdur 30 mg/day (oral nitroglycerin to take every day)  Nitro tablets under the tongue--new Rx sent to [facility]

## 2022-12-17 NOTE — Assessment & Plan Note
Lab Results   Component Value Date    CHOL 98 12/23/2020    TRIG 122 12/23/2020    HDL 38 (L) 12/23/2020    LDL 36 12/23/2020    VLDL 24 12/23/2020    NONHDLCHOL 87 10/16/2009    CHOLHDLC 3 12/23/2020      LDL treated to goal on simva 80/day and fenofibrate 50 mg BID.

## 2022-12-17 NOTE — Progress Notes
Date of Service: 12/17/2022    Douglas Fernandez is a 82 y.o. male.       HPI     Douglas Fernandez was in the [place] clinic today for follow-up regarding coronary disease.  He was accompanied by his son.  We have seen him very infrequently over the years.  I last saw him in June, 2023.     His coronary disease was not particularly symptomatic when he presented with an acute coronary syndrome and underwent LAD stenting back in 2009.  His last stress test was about 4 years ago and his ejection fraction was 62% without any perfusion abnormalities.  He really didn't tolerate the regadenson very well and would like to avoid further stress testing if possible.    He is starting to have some breakthrough AF that provokes some angina.  It typically occurs when he first lies down in bed.  It's happening more frequently lately.    He continues to have breathlessness with minor exertion.  He has an oximeter and says that his oxygen saturation is always within normal limits.  Nevertheless, he has significant lung disease in addition to his coronary disease and his dyspnea is probably a combination of both.     He is reasonably compliant with oral anticoagulation management.  He has a lot of bruises and has a big area of abrasion on his elbow after he stumbled in the bathroom a couple of days ago.  He denies any serious bleeding complications.     He has not had any TIA or stroke symptoms.  He does get problems with peripheral edema.         Vitals:    12/17/22 1313   BP: (!) 151/84   BP Source: Arm, Right Lower   Pulse: 57   SpO2: 97%   O2 Device: None (Room air)   PainSc: Zero   Weight: 122.5 kg (270 lb)   Height: 175.3 cm (5' 9)     Body mass index is 39.87 kg/m?Marland Kitchen     Past Medical History  Patient Active Problem List    Diagnosis Date Noted    COVID-19 vaccine administered 09/28/2019    On dofetilide therapy 09/28/2019    Pre-operative cardiovascular examination 02/04/2017    On dofetilide therapy 08/20/2016    Warfarin anticoagulation 08/20/2016    Bilateral edema of lower extremity 05/08/2014    Sleep apnea 12/06/2012    Renal failure 08/25/2012    Atrial fibrillation (HCC) 06/30/2012     06/24/2012 - Symptoms of DOE, AF on EKG.  07/28/2012 - TEE and DCCV:  Successful DC cardioversion of Atrial fibrillation to sinus rhythm.  08/25/2012 - Pt in AF, Multaq stopped.  11/21/2012 - Hospital admission for Tikosyn initiation, DCCV to restore NSR.      History of tobacco abuse 06/30/2012    SOB (shortness of breath) 12/19/2010    Chest pain 10/15/2009    Type II diabetes mellitus (HCC) 10/15/2009    CAD (coronary artery disease) 11/25/2006     11/2006:  DES to mid and distal LAD after abnormal stress testing, done for chest discomfort  10/09 :   DES to mid-LAD after presentation with ACS related to restenosis.  Noted to have 60% distal RCA stenosis at that time.  01/11/2011 - Stress Test:  This is a normal study.  There are no fixed or reversible perfusion abnormalities identified.  The LV function is preserved with an EF calculated at 67 percent.  The ECG portion of  this study is unremarkable and not suggestive of ischemia.  No high risk prognostic indicators are associated with this study.        Abnormal cardiovascular stress test     Class 3 severe obesity due to excess calories with serious comorbidity and body mass index (BMI) of 40.0 to 44.9 in adult [facility])     Hypertension     Hyperlipidemia          Review of Systems   Constitutional: Negative.   HENT: Negative.     Eyes: Negative.    Cardiovascular: Negative.    Respiratory: Negative.     Endocrine: Negative.    Hematologic/Lymphatic: Negative.    Skin: Negative.    Musculoskeletal: Negative.    Gastrointestinal: Negative.    Genitourinary: Negative.    Neurological: Negative.    Psychiatric/Behavioral: Negative.     Allergic/Immunologic: Negative.        Physical Exam    Physical Exam   General Appearance: no distress   Skin: warm, no ulcers or xanthomas   Digits and Nails: no cyanosis or clubbing   Eyes: conjunctivae and lids normal, pupils are equal and round   Teeth/Gums/Palate: dentition unremarkable, no lesions   Lips & Oral Mucosa: no pallor or cyanosis   Neck Veins: normal JVP , neck veins are not distended   Thyroid: no nodules, masses, tenderness or enlargement   Chest Inspection: chest is normal in appearance   Respiratory Effort: breathing comfortably, no respiratory distress   Auscultation/Percussion: lungs clear to auscultation, no rales or rhonchi, no wheezing   PMI: PMI not enlarged or displaced   Cardiac Rhythm: regular rhythm and normal rate   Cardiac Auscultation: S1, S2 normal, no rub, no gallop   Murmurs: no murmur   Peripheral Circulation: normal peripheral circulation   Carotid Arteries: normal carotid upstroke bilaterally, no bruits   Radial Arteries: normal symmetric radial pulses   Abdominal Aorta: no abdominal aortic bruit   Pedal Pulses: normal symmetric pedal pulses   Lower Extremity Edema: no lower extremity edema   Abdominal Exam: soft, non-tender, no masses, bowel sounds normal   Liver & Spleen: no organomegaly   Gait & Station: wheelchair  Muscle Strength: normal muscle tone   Orientation: oriented to time, place and person   Affect & Mood: appropriate and sustained affect   Language and Memory: patient responsive and seems to comprehend information   Neurologic Exam: neurological assessment grossly intact   Other: moves all extremities      Cardiovascular Studies    EKG:  SR, rate 58.  QTc ~405 msec    Cardiovascular Health Factors  Vitals BP Readings from Last 3 Encounters:   12/17/22 (!) 151/84   10/30/21 (!) 148/80   09/28/19 128/72     Wt Readings from Last 3 Encounters:   12/17/22 122.5 kg (270 lb)   10/30/21 117.8 kg (259 lb 12.8 oz)   09/28/19 124.7 kg (275 lb)     BMI Readings from Last 3 Encounters:   12/17/22 39.87 kg/m?   10/30/21 38.37 kg/m?   09/28/19 40.61 kg/m?      Smoking Social History     Tobacco Use   Smoking Status Former    Current packs/day: 0.00    Average packs/day: 2.0 packs/day for 30.0 years (60.0 ttl pk-yrs)    Types: Cigarettes    Start date: 11/29/1976    Quit date: 11/30/2006    Years since quitting: 16.0   Smokeless Tobacco Never  Lipid Profile Cholesterol   Date Value Ref Range Status   12/23/2020 98  Final     HDL   Date Value Ref Range Status   12/23/2020 38 (L) >=40 Final     LDL   Date Value Ref Range Status   12/23/2020 36  Final     Triglycerides   Date Value Ref Range Status   12/23/2020 122  Final      Blood Sugar Hemoglobin A1C   Date Value Ref Range Status   11/04/2022 6.6 (H) 4.5 - 6.5 Final     Glucose   Date Value Ref Range Status   04/22/2022 97  Final   12/23/2020 83  Final   07/12/2019 90  Final     Glucose, POC   Date Value Ref Range Status   11/23/2012 90 70 - 100 MG/DL Final   45/40/9811 914 (H) 70 - 100 MG/DL Final   78/29/5621 308 (H) 70 - 100 MG/DL Final          Problems Addressed Today  Encounter Diagnoses   Name Primary?    Paroxysmal atrial fibrillation (HCC) Yes    Coronary artery disease involving native heart without angina pectoris, unspecified vessel or lesion type     Mixed hyperlipidemia     Primary hypertension     Class 3 severe obesity due to excess calories with serious comorbidity and body mass index (BMI) of 40.0 to 44.9 in adult [facility])        Assessment and Plan       Atrial fibrillation (HCC)  He remains on Tikosyn, metoprolol, and warfarin.  He's on a low dose of Tikosyn due to renal dysfunction and I don't think we should just increase the dose.    I  think it's reasonable to stop the Tikosyn and start amiodarone.  I went ahead and sent in a prescription for oral loading of amiodarone and we'll eventually get him down to 200 mg/day.  I'll see him back in about 3 months.    He'll continue warfarin for now.  I didn't mention a Watchman yet, but this is probably something we'll have to consider in the near future.    CAD (coronary artery disease)  Last assessment was a regadenoson thallium in 2019.  No ischemia, EF 61%    I ordered an echocardiogram to determine whether he's having trouble with LV dysfunction or valve disease as a contributor to his dyspnea.  I'll try to avoid stress testing and have encouraged him to try a SL NTG for the dyspnea.  I've also started Imdur 30 mg/day to see if that helps.    I'll plan to see him back in about 3 months.    Hyperlipidemia  Lab Results   Component Value Date    CHOL 98 12/23/2020    TRIG 122 12/23/2020    HDL 38 (L) 12/23/2020    LDL 36 12/23/2020    VLDL 24 12/23/2020    NONHDLCHOL 87 10/16/2009    CHOLHDLC 3 12/23/2020      LDL treated to goal on simva 80/day and fenofibrate 50 mg BID.    Hypertension  Losartan 100 and metoprolol tartrate 50 BID.    Current Medications (including today's revisions)   albuterol (VENTOLIN HFA) 90 mcg/actuation inhaler Inhale two puffs by mouth into the lungs every 6 hours as needed for Wheezing or Shortness of Breath. Shake well before use.    amiodarone (PACERONE) 200 mg tablet Take with food.  2 tabs twice daily X 2 weeks, then 2 tabs in AM, 1 tab in PM X 1 month, then 2 tabs daily X 3 months, then 1 daily    budesonide/formoterol (SYMBICORT) 160/4.5 mcg HFAA inhalation Inhale two puffs by mouth into the lungs twice daily.      fenofibrate micronized (LOFIBRA) 134 mg capsule TAKE 1 CAPSULE DAILY BEFORE BREAKFAST (Patient taking differently: 120 mg.)    HYDROcodone/acetaminophen (NORCO) 5/325 mg tablet Take one tablet by mouth every 6 hours as needed for Pain.    insulin glargine (LANTUS) 100 unit/mL injection Inject fifty five Units under the skin at bedtime daily. 1 vial contains 10mL    ipratropium bromide (ATROVENT HFA) 17 mcg/actuation inhaler Inhale two puffs by mouth into the lungs every 6 hours.    ipratropium bromide (ATROVENT) 0.02 % nebulizer solution Inhale 2.5 mL by mouth into the lungs four times daily.    isosorbide mononitrate ER (IMDUR) 30 mg tablet,extended release 24 hr Take one tablet by mouth every morning.    magnesium oxide (MAG-OX) 400 mg tablet TAKE 1 TABLET BY MOUTH TWICE A DAY    metFORMIN (GLUCOPHAGE) 500 mg tablet Take one tablet by mouth twice daily.    metoprolol tartrate (LOPRESSOR) 50 mg tablet Take one tablet by mouth twice daily.    nitroglycerin (NITROSTAT) 0.4 mg tablet Place one tablet under tongue every 5 minutes as needed.    omeprazole DR(+) (PRILOSEC) 20 mg PO capsule Take one capsule by mouth at bedtime daily.    potassium chloride SR (K-DUR) 10 mEq tablet Take one tablet by mouth twice daily. Take with a meal and a full glass of water.    SYNTHROID 75 mcg tablet Take one tablet by mouth daily.    tiotropium (SPIRIVA WITH HANDIHALER) 18 mcg capsule for inhaler Place one capsule into inhaler and inhale into lungs as directed daily.    torsemide (DEMADEX) 20 mg tablet Take one tablet by mouth daily.    valsartan (DIOVAN) 320 mg tablet Take one tablet by mouth daily.    warfarin (COUMADIN) 4 mg tablet TAKE 1 TO 2 TABLETS BY MOUTH  DAILY AS DIRECTED (BY  CARDIOLOGY)     Total time spent on today's office visit was 45 minutes.  This includes face-to-face in person visit with patient as well as nonface-to-face time including review of the EMR, outside records, labs, radiologic studies, echocardiogram & other cardiovascular studies, formation of treatment plan, after visit summary, future disposition, and lastly on documentation.

## 2022-12-17 NOTE — Assessment & Plan Note
Losartan 100 and metoprolol tartrate 50 BID.

## 2022-12-18 ENCOUNTER — Encounter: Admit: 2022-12-18 | Discharge: 2022-12-18 | Payer: MEDICARE

## 2022-12-18 DIAGNOSIS — I2510 Atherosclerotic heart disease of native coronary artery without angina pectoris: Secondary | ICD-10-CM

## 2022-12-18 DIAGNOSIS — I480 Paroxysmal atrial fibrillation: Secondary | ICD-10-CM

## 2022-12-18 DIAGNOSIS — Z79899 Other long term (current) drug therapy: Secondary | ICD-10-CM

## 2022-12-18 NOTE — Progress Notes
Amiodarone Monitoring 12/18/22:     [facility] Protocol:  Due at baseline and every 6 months:  Labs: ALT, AST, TSH.    Due at baseline and every 12 months: K, Mg, Office Visit, ECG(can use V rate for HR), Chest Imaging (CXR, PFT or high resolution CT), BP and an eye exam.     Amiodarone status:  Amiodarone testing needed: CMP, Magnesium, TSH with Free T4 Reflex, and CXR and follow-up in 180 days.    Most recent test results (every 6 months: AST, ALT, TSH w Free T4 within normal limits. Every 12 months: Potassium, Magnesium)  Lab Results   Component Value Date/Time    AST 43 (H) 04/22/2022 12:00 AM    ALT 76 (H) 04/22/2022 12:00 AM    TSH 3.00 04/22/2022 12:00 AM    K 4.5 04/22/2022 12:00 AM    MG 2.1 11/25/2012 12:00 AM       BP:   BP Readings from Last 1 Encounters:   12/17/22 (!) 151/84       ECG:   Most recent results for 12-Lead ECG   ECG 12-LEAD    Collection Time: 12/17/22  1:39 PM   Result Value Status    VENTRICULAR RATE 58 Final    P-R INTERVAL 212 Final    QRS DURATION 92 Final    Q-T INTERVAL 410 Final    QTC CALCULATION (BAZETT) 403 Final    P AXIS 80 Final    R AXIS 24 Final    T AXIS 90 Final    Impression    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

## 2022-12-23 ENCOUNTER — Encounter: Admit: 2022-12-23 | Discharge: 2022-12-23 | Payer: MEDICARE

## 2022-12-23 DIAGNOSIS — I480 Paroxysmal atrial fibrillation: Secondary | ICD-10-CM

## 2022-12-23 DIAGNOSIS — Z7901 Long term (current) use of anticoagulants: Secondary | ICD-10-CM

## 2022-12-30 ENCOUNTER — Encounter: Admit: 2022-12-30 | Discharge: 2022-12-30 | Payer: MEDICARE

## 2022-12-30 ENCOUNTER — Ambulatory Visit: Admit: 2022-12-30 | Discharge: 2022-12-31 | Payer: MEDICARE

## 2022-12-30 DIAGNOSIS — Z7901 Long term (current) use of anticoagulants: Secondary | ICD-10-CM

## 2022-12-30 DIAGNOSIS — I2510 Atherosclerotic heart disease of native coronary artery without angina pectoris: Secondary | ICD-10-CM

## 2022-12-30 DIAGNOSIS — Z87898 Personal history of other specified conditions: Secondary | ICD-10-CM

## 2022-12-30 DIAGNOSIS — Z79899 Other long term (current) drug therapy: Secondary | ICD-10-CM

## 2022-12-30 DIAGNOSIS — I480 Paroxysmal atrial fibrillation: Secondary | ICD-10-CM

## 2022-12-30 LAB — COMPREHENSIVE METABOLIC PANEL
ALBUMIN: 3.3 — ABNORMAL LOW (ref 3.4–4.8)
ALK PHOSPHATASE: 115
ALT: 38
ANION GAP: 14
AST: 23
BLD UREA NITROGEN: 33 — ABNORMAL HIGH (ref 8.4–25.7)
CALCIUM: 8.7 — ABNORMAL LOW (ref 8.8–10.0)
CO2: 22 — ABNORMAL LOW (ref 23–31)
CREATININE: 1.2
GLUCOSE,PANEL: 86
SODIUM: 142
TOTAL BILIRUBIN: 0.3
TOTAL PROTEIN: 5.9 — ABNORMAL LOW (ref 6.2–8.1)

## 2022-12-30 LAB — TSH WITH FREE T4 REFLEX: TSH: 4

## 2022-12-30 LAB — PROTIME INR (PT): INR: 2.6

## 2022-12-30 LAB — MAGNESIUM: MAGNESIUM: 2

## 2022-12-30 NOTE — Telephone Encounter
Faxed to [facility] at [phone]

## 2022-12-30 NOTE — Telephone Encounter
Vanice Sarah, MD  You42 minutes ago (1:15 PM)  OK to go ahead with amiodarone, thanks.    Dr. Barry Dienes gives approval to continue current treatment of amiodarone.

## 2022-12-30 NOTE — Telephone Encounter
Received fax from [facility] to clarify a new prescription of Amiodarone. 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 [facility] says due to the incorporation of iodine into its chemical structure, Amiodarone is contraindicated in patients with iodine hypersensitivity and would like to verify if the prescriber is aware of the allergy.    Will route to Dr. Barry Dienes for his review and recommendations.

## 2022-12-31 DIAGNOSIS — Z87898 Personal history of other specified conditions: Secondary | ICD-10-CM

## 2022-12-31 MED ORDER — WARFARIN 4 MG PO TAB
ORAL_TABLET | ORAL | 3 refills | 90.00000 days | Status: AC
Start: 2022-12-31 — End: ?

## 2023-01-05 ENCOUNTER — Encounter: Admit: 2023-01-05 | Discharge: 2023-01-05 | Payer: MEDICARE

## 2023-01-05 NOTE — Telephone Encounter
-----   Message from Jonelle Sports, MD sent at 01/01/2023  3:24 PM CDT -----  [facility], can you please let the patient or his son know that the echo looks OK?      Cc:  Dr. Alona Bene

## 2023-01-06 ENCOUNTER — Encounter: Admit: 2023-01-06 | Discharge: 2023-01-06 | Payer: MEDICARE

## 2023-01-06 DIAGNOSIS — I480 Paroxysmal atrial fibrillation: Secondary | ICD-10-CM

## 2023-01-06 DIAGNOSIS — Z7901 Long term (current) use of anticoagulants: Secondary | ICD-10-CM

## 2023-01-06 LAB — PROTIME INR (PT): INR: 2.6

## 2023-01-06 MED ORDER — ISOSORBIDE MONONITRATE 30 MG PO TB24
30 mg | ORAL_TABLET | Freq: Every morning | ORAL | 3 refills | 90.00000 days | Status: AC
Start: 2023-01-06 — End: ?

## 2023-01-06 MED ORDER — AMIODARONE 200 MG PO TAB
ORAL_TABLET | ORAL | 4 refills | 42.00000 days | Status: AC
Start: 2023-01-06 — End: ?

## 2023-01-06 NOTE — Telephone Encounter
Called and discussed results with patient.  No questions at this time.  Pt will callback with any questions, concerns or problems.

## 2023-01-12 ENCOUNTER — Encounter: Admit: 2023-01-12 | Discharge: 2023-01-12 | Payer: MEDICARE

## 2023-01-12 DIAGNOSIS — I480 Paroxysmal atrial fibrillation: Secondary | ICD-10-CM

## 2023-01-12 DIAGNOSIS — Z7901 Long term (current) use of anticoagulants: Secondary | ICD-10-CM

## 2023-01-12 LAB — PROTIME INR (PT): INR: 2.3

## 2023-01-20 ENCOUNTER — Encounter: Admit: 2023-01-20 | Discharge: 2023-01-20 | Payer: MEDICARE

## 2023-01-20 DIAGNOSIS — I480 Paroxysmal atrial fibrillation: Secondary | ICD-10-CM

## 2023-01-20 DIAGNOSIS — Z7901 Long term (current) use of anticoagulants: Secondary | ICD-10-CM

## 2023-01-20 LAB — PROTIME INR (PT): INR: 2.4

## 2023-01-27 ENCOUNTER — Encounter: Admit: 2023-01-27 | Discharge: 2023-01-27 | Payer: MEDICARE

## 2023-01-27 DIAGNOSIS — Z7901 Long term (current) use of anticoagulants: Secondary | ICD-10-CM

## 2023-01-27 DIAGNOSIS — I480 Paroxysmal atrial fibrillation: Secondary | ICD-10-CM

## 2023-01-27 LAB — PROTIME INR (PT): INR: 2.6

## 2023-02-02 ENCOUNTER — Encounter: Admit: 2023-02-02 | Discharge: 2023-02-02 | Payer: MEDICARE

## 2023-02-02 DIAGNOSIS — I480 Paroxysmal atrial fibrillation: Secondary | ICD-10-CM

## 2023-02-02 DIAGNOSIS — Z7901 Long term (current) use of anticoagulants: Secondary | ICD-10-CM

## 2023-02-02 LAB — PROTIME INR (PT): INR: 2.9 10*3/uL (ref 1.8–7.0)

## 2023-02-05 ENCOUNTER — Encounter: Admit: 2023-02-05 | Discharge: 2023-02-05 | Payer: MEDICARE

## 2023-02-05 DIAGNOSIS — I10 Essential (primary) hypertension: Secondary | ICD-10-CM

## 2023-02-05 DIAGNOSIS — R9439 Abnormal result of other cardiovascular function study: Secondary | ICD-10-CM

## 2023-02-05 DIAGNOSIS — R0602 Shortness of breath: Secondary | ICD-10-CM

## 2023-02-05 DIAGNOSIS — I2510 Atherosclerotic heart disease of native coronary artery without angina pectoris: Secondary | ICD-10-CM

## 2023-02-05 DIAGNOSIS — R079 Chest pain, unspecified: Secondary | ICD-10-CM

## 2023-02-05 DIAGNOSIS — Z87891 Personal history of nicotine dependence: Secondary | ICD-10-CM

## 2023-02-05 DIAGNOSIS — Z7901 Long term (current) use of anticoagulants: Secondary | ICD-10-CM

## 2023-02-05 DIAGNOSIS — E7800 Pure hypercholesterolemia, unspecified: Secondary | ICD-10-CM

## 2023-02-05 DIAGNOSIS — I480 Paroxysmal atrial fibrillation: Secondary | ICD-10-CM

## 2023-02-05 DIAGNOSIS — N189 Chronic kidney disease, unspecified: Secondary | ICD-10-CM

## 2023-02-05 DIAGNOSIS — E118 Type 2 diabetes mellitus with unspecified complications: Secondary | ICD-10-CM

## 2023-02-05 MED ORDER — METOPROLOL TARTRATE 50 MG PO TAB
50 mg | ORAL_TABLET | Freq: Two times a day (BID) | ORAL | 3 refills | 90.00000 days | Status: AC
Start: 2023-02-05 — End: ?

## 2023-02-18 ENCOUNTER — Encounter: Admit: 2023-02-18 | Discharge: 2023-02-18 | Payer: MEDICARE

## 2023-03-03 ENCOUNTER — Encounter: Admit: 2023-03-03 | Discharge: 2023-03-03 | Payer: MEDICARE

## 2023-03-03 NOTE — Progress Notes
Amiodarone Monitoring 03/03/23:     [facility] Protocol:  Due at baseline and every 6 months:  Labs: ALT, AST, TSH.    Due at baseline and every 12 months: K, Mg, Office Visit, ECG(can use V rate for HR), Chest Imaging (CXR, PFT or high resolution CT), BP and an eye exam.     Amiodarone status:  Amiodarone monitoring completed.  Next amiodarone review is due in 180 days.    Most recent test results (every 6 months: AST, ALT, TSH w Free T4 within normal limits. Every 12 months: Potassium, Magnesium)  Lab Results   Component Value Date/Time    AST 23 12/30/2022 12:00 AM    ALT 38 12/30/2022 12:00 AM    TSH 4.00 12/30/2022 12:00 AM    K 4.5 12/30/2022 12:00 AM    MG 2.0 12/30/2022 12:00 AM       BP:   BP Readings from Last 1 Encounters:   12/30/22 (!) 142/72       ECG:   Most recent results for 12-Lead ECG   ECG 12-LEAD    Collection Time: 12/17/22  1:39 PM   Result Value Status    VENTRICULAR RATE 58 Final    P-R INTERVAL 212 Final    QRS DURATION 92 Final    Q-T INTERVAL 410 Final    QTC CALCULATION (BAZETT) 403 Final    P AXIS 80 Final    R AXIS 24 Final    T AXIS 90 Final    Impression    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 Yes

## 2023-03-18 ENCOUNTER — Encounter: Admit: 2023-03-18 | Discharge: 2023-03-18 | Payer: MEDICARE

## 2023-03-18 DIAGNOSIS — I480 Paroxysmal atrial fibrillation: Secondary | ICD-10-CM

## 2023-04-22 ENCOUNTER — Encounter: Admit: 2023-04-22 | Discharge: 2023-04-22 | Payer: MEDICARE

## 2023-04-27 ENCOUNTER — Encounter: Admit: 2023-04-27 | Discharge: 2023-04-27 | Payer: MEDICARE

## 2023-06-04 ENCOUNTER — Encounter: Admit: 2023-06-04 | Discharge: 2023-06-04 | Payer: MEDICARE

## 2023-06-04 NOTE — Progress Notes
Amiodarone Monitoring 06/04/23:     [facility] Protocol:  Due at baseline and every 6 months:  Labs: ALT, AST, TSH.    Due at baseline and every 12 months: K, Mg, Office Visit, ECG(can use V rate for HR), Chest Imaging (CXR, PFT or high resolution CT), BP and an eye exam.     Amiodarone status:  Amiodarone monitoring completed.  Next amiodarone review is due in 180 days.    Most recent test results (every 6 months: AST, ALT, TSH w Free T4 within normal limits. Every 12 months: Potassium, Magnesium)  Lab Results   Component Value Date/Time    AST 22 02/23/2023 12:00 AM    ALT 16 02/23/2023 12:00 AM    TSH 4.00 12/30/2022 12:00 AM    K 4.2 03/03/2023 12:00 AM    MG 1.8 02/23/2023 12:00 AM       BP:   BP Readings from Last 1 Encounters:   12/30/22 (!) 142/72       ECG:   Most recent results for 12-Lead ECG   ECG 12-LEAD    Collection Time: 12/17/22  1:39 PM   Result Value Status    VENTRICULAR RATE 58 Final    P-R INTERVAL 212 Final    QRS DURATION 92 Final    Q-T INTERVAL 410 Final    QTC CALCULATION (BAZETT) 403 Final    P AXIS 80 Final    R AXIS 24 Final    T AXIS 90 Final    Impression    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 A complete set of results can be found in Results Review.       CXR, PFT or high resolution CT: 05/21/23    Last Cardiology OV - 12/17/22    Eye exam -     Provider notified of abnormal results? Not Applicable

## 2023-06-16 ENCOUNTER — Encounter: Admit: 2023-06-16 | Discharge: 2023-06-16 | Payer: MEDICARE

## 2023-08-18 ENCOUNTER — Encounter: Admit: 2023-08-18 | Discharge: 2023-08-18

## 2023-08-18 DIAGNOSIS — Z79899 Other long term (current) drug therapy: Secondary | ICD-10-CM

## 2023-08-18 DIAGNOSIS — I10 Essential (primary) hypertension: Secondary | ICD-10-CM

## 2023-08-18 DIAGNOSIS — I480 Paroxysmal atrial fibrillation: Secondary | ICD-10-CM

## 2023-08-18 NOTE — Progress Notes
 Amiodarone Monitoring 08/18/23:     [facility] Protocol:  Due at baseline and every 6 months:  Labs: ALT, AST, TSH.    Due at baseline and every 12 months: K, Mg, Office Visit, ECG(can use V rate for HR), Chest Imaging (CXR, PFT or high resolution CT), BP and an eye exam.     Amiodarone status:  Amiodarone testing needed: CMP, Magnesium, and TSH with Free T4 Reflex and follow-up in 180 days.    Most recent test results (every 6 months: AST, ALT, TSH w Free T4 within normal limits. Every 12 months: Potassium, Magnesium)  Lab Results   Component Value Date/Time    AST 22 02/23/2023 12:00 AM    ALT 16 02/23/2023 12:00 AM    TSH 4.00 12/30/2022 12:00 AM    K 4.2 03/03/2023 12:00 AM    MG 1.8 02/23/2023 12:00 AM       BP:   BP Readings from Last 1 Encounters:   12/30/22 (!) 142/72       ECG:   Most recent results for 12-Lead ECG   ECG 12-LEAD    Collection Time: 12/17/22  1:39 PM   Result Value Status    VENTRICULAR RATE 58 Final    P-R INTERVAL 212 Final    QRS DURATION 92 Final    Q-T INTERVAL 410 Final    QTC CALCULATION (BAZETT) 403 Final    P AXIS 80 Final    R AXIS 24 Final    T AXIS 90 Final    Impression    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 A complete set of results can be found in Results Review.

## 2023-10-01 ENCOUNTER — Encounter: Admit: 2023-10-01 | Discharge: 2023-10-01 | Payer: MEDICARE

## 2023-10-20 ENCOUNTER — Encounter: Admit: 2023-10-20 | Discharge: 2023-10-20 | Payer: MEDICARE

## 2024-01-31 ENCOUNTER — Encounter: Admit: 2024-01-31 | Discharge: 2024-01-31 | Payer: MEDICARE

## 2024-01-31 NOTE — Telephone Encounter
 Called and discussed amiodarone  monitoring with patient's daughter Douglas Fernandez. She states that Douglas Fernandez is basically homebound and they are not able to get him out to get his labs or chest x-ray. They have changed their follow up appointment with Dr. Dusty to telehealth, so they can not get monitoring completed while out for the office visit.    Will route to Dr. Quin for review and recommendations.

## 2024-02-10 ENCOUNTER — Encounter: Admit: 2024-02-10 | Discharge: 2024-02-10 | Payer: MEDICARE

## 2024-02-10 MED ORDER — ISOSORBIDE MONONITRATE 30 MG PO TB24
30 mg | ORAL_TABLET | Freq: Every morning | ORAL | 3 refills | 90.00000 days | Status: AC
Start: 2024-02-10 — End: ?

## 2024-02-14 ENCOUNTER — Encounter: Admit: 2024-02-14 | Discharge: 2024-02-14 | Payer: MEDICARE

## 2024-02-14 MED ORDER — AMIODARONE 200 MG PO TAB
200 mg | ORAL_TABLET | Freq: Every day | ORAL | 3 refills | 42.00000 days | Status: AC
Start: 2024-02-14 — End: ?

## 2024-04-26 ENCOUNTER — Encounter: Admit: 2024-04-26 | Discharge: 2024-04-26 | Payer: MEDICARE

## 2024-05-09 ENCOUNTER — Encounter: Admit: 2024-05-09 | Discharge: 2024-05-09 | Payer: MEDICARE

## 2024-05-09 NOTE — Telephone Encounter [[id number]]
 Called patient's daughter and left a voicemail stating telehealth visit links have been sent to both her and the patient. Requested that they click the link and enter the telehealth ASAP so patient can be roomed for provider. Tried to reach patient on his number but there was no answer and no way to leave a voicemail.
